# Patient Record
Sex: Female | Born: 1965 | Hispanic: Yes | Smoking: Never smoker
Health system: Southern US, Community
[De-identification: ages and names within clinical notes are randomized; demographics above are authoritative.]

## PROBLEM LIST (undated history)

## (undated) DIAGNOSIS — Z87442 Personal history of urinary calculi: Secondary | ICD-10-CM

## (undated) DIAGNOSIS — C801 Malignant (primary) neoplasm, unspecified: Secondary | ICD-10-CM

## (undated) DIAGNOSIS — F32A Depression, unspecified: Secondary | ICD-10-CM

## (undated) DIAGNOSIS — R42 Dizziness and giddiness: Secondary | ICD-10-CM

## (undated) DIAGNOSIS — M1990 Unspecified osteoarthritis, unspecified site: Secondary | ICD-10-CM

## (undated) DIAGNOSIS — J189 Pneumonia, unspecified organism: Secondary | ICD-10-CM

## (undated) DIAGNOSIS — I8393 Asymptomatic varicose veins of bilateral lower extremities: Secondary | ICD-10-CM

## (undated) DIAGNOSIS — F329 Major depressive disorder, single episode, unspecified: Secondary | ICD-10-CM

## (undated) DIAGNOSIS — F419 Anxiety disorder, unspecified: Secondary | ICD-10-CM

## (undated) HISTORY — PX: BREAST LUMPECTOMY: SHX2

## (undated) HISTORY — PX: TONSILLECTOMY: SUR1361

## (undated) HISTORY — DX: Malignant (primary) neoplasm, unspecified: C80.1

## (undated) HISTORY — PX: CHOLECYSTECTOMY: SHX55

## (undated) HISTORY — PX: DIAGNOSTIC LAPAROSCOPY: SUR761

---
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
## 2006-03-16 ENCOUNTER — Ambulatory Visit: Payer: Self-pay | Admitting: Unknown Physician Specialty

## 2009-09-22 ENCOUNTER — Ambulatory Visit: Payer: Self-pay

## 2010-12-29 ENCOUNTER — Ambulatory Visit: Payer: Self-pay

## 2012-07-09 ENCOUNTER — Ambulatory Visit: Payer: Self-pay | Admitting: Family Medicine

## 2012-11-14 HISTORY — PX: TONSILLECTOMY: SUR1361

## 2014-03-25 DIAGNOSIS — I8393 Asymptomatic varicose veins of bilateral lower extremities: Secondary | ICD-10-CM | POA: Insufficient documentation

## 2014-03-25 DIAGNOSIS — R32 Unspecified urinary incontinence: Secondary | ICD-10-CM | POA: Insufficient documentation

## 2014-05-08 ENCOUNTER — Ambulatory Visit: Payer: Self-pay | Admitting: Internal Medicine

## 2014-07-25 ENCOUNTER — Ambulatory Visit: Payer: Self-pay | Admitting: Internal Medicine

## 2015-07-17 ENCOUNTER — Other Ambulatory Visit: Payer: Self-pay | Admitting: Infectious Diseases

## 2015-07-17 ENCOUNTER — Ambulatory Visit
Admission: RE | Admit: 2015-07-17 | Discharge: 2015-07-17 | Disposition: A | Discharge status: Home / Self Care | Payer: BLUE CROSS/BLUE SHIELD | Source: Ambulatory Visit | Attending: Infectious Diseases | Admitting: Infectious Diseases

## 2015-07-17 ENCOUNTER — Ambulatory Visit
Admission: RE | Admit: 2015-07-17 | Discharge: 2015-07-17 | Disposition: A | Discharge status: Home / Self Care | Payer: BLUE CROSS/BLUE SHIELD | Source: Ambulatory Visit | Attending: Internal Medicine | Admitting: Internal Medicine

## 2015-07-17 DIAGNOSIS — R7611 Nonspecific reaction to tuberculin skin test without active tuberculosis: Secondary | ICD-10-CM

## 2015-07-17 DIAGNOSIS — R079 Chest pain, unspecified: Secondary | ICD-10-CM | POA: Insufficient documentation

## 2015-12-30 HISTORY — PX: TUBAL LIGATION: SHX77

## 2016-07-12 ENCOUNTER — Other Ambulatory Visit: Payer: Self-pay | Admitting: Internal Medicine

## 2016-07-12 DIAGNOSIS — Z1231 Encounter for screening mammogram for malignant neoplasm of breast: Secondary | ICD-10-CM

## 2016-07-27 ENCOUNTER — Ambulatory Visit
Admission: RE | Admit: 2016-07-27 | Discharge: 2016-07-27 | Disposition: A | Discharge status: Home / Self Care | Payer: BLUE CROSS/BLUE SHIELD | Source: Ambulatory Visit | Attending: Internal Medicine | Admitting: Internal Medicine

## 2016-07-27 ENCOUNTER — Encounter: Payer: Self-pay | Admitting: Radiology

## 2016-07-27 DIAGNOSIS — Z1231 Encounter for screening mammogram for malignant neoplasm of breast: Secondary | ICD-10-CM | POA: Diagnosis present

## 2016-11-25 ENCOUNTER — Encounter (INDEPENDENT_AMBULATORY_CARE_PROVIDER_SITE_OTHER): Payer: Self-pay

## 2016-11-25 ENCOUNTER — Encounter (INDEPENDENT_AMBULATORY_CARE_PROVIDER_SITE_OTHER): Payer: BLUE CROSS/BLUE SHIELD

## 2016-11-25 ENCOUNTER — Ambulatory Visit (INDEPENDENT_AMBULATORY_CARE_PROVIDER_SITE_OTHER): Payer: Self-pay | Admitting: Vascular Surgery

## 2016-12-27 ENCOUNTER — Ambulatory Visit (INDEPENDENT_AMBULATORY_CARE_PROVIDER_SITE_OTHER): Payer: BLUE CROSS/BLUE SHIELD | Admitting: Vascular Surgery

## 2016-12-27 ENCOUNTER — Encounter (INDEPENDENT_AMBULATORY_CARE_PROVIDER_SITE_OTHER): Payer: Self-pay | Admitting: Vascular Surgery

## 2016-12-27 ENCOUNTER — Ambulatory Visit (INDEPENDENT_AMBULATORY_CARE_PROVIDER_SITE_OTHER): Payer: BLUE CROSS/BLUE SHIELD

## 2016-12-27 ENCOUNTER — Other Ambulatory Visit (INDEPENDENT_AMBULATORY_CARE_PROVIDER_SITE_OTHER): Payer: Self-pay | Admitting: Vascular Surgery

## 2016-12-27 VITALS — BP 105/64 | HR 57 | Resp 16 | Ht 60.0 in | Wt 205.0 lb

## 2016-12-27 DIAGNOSIS — M7989 Other specified soft tissue disorders: Secondary | ICD-10-CM

## 2016-12-27 DIAGNOSIS — M79605 Pain in left leg: Secondary | ICD-10-CM

## 2016-12-27 DIAGNOSIS — M79604 Pain in right leg: Secondary | ICD-10-CM

## 2016-12-27 DIAGNOSIS — I83813 Varicose veins of bilateral lower extremities with pain: Secondary | ICD-10-CM

## 2016-12-27 NOTE — Assessment & Plan Note (Signed)
See treatment plan as below 

## 2016-12-27 NOTE — Assessment & Plan Note (Signed)
From venous disease refractory to conservative therapy.

## 2016-12-27 NOTE — Progress Notes (Signed)
Patient ID: Kelsey Chavez, female   DOB: 1965-11-18, 51 y.o.   MRN: [id number]  Chief Complaint  Patient presents with  . Re-evaluation    3 month follow up    HPI Kelsey Chavez is a 51 y.o. female.  Patient returns in follow up of their venous disease.  They have done their best to comply with the prescribed conservative therapies of compression stockings, leg elevation, exercise, and still requires anti-inflammatories for discomfort and has symptoms that are persistent and bothersome on a daily basis, affecting their activities of daily living and normal activities.  The venous reflux study demonstrates Bilateral great saphenous vein reflux without DVT or superficial thrombophlebitis. Her arterial studies were normal as well.Marland Kitchen   HPI  No past medical history on file.  No past surgical history on file.  No family history on file. No bleeding or clotting disorders  Social History Social History  Substance Use Topics  . Smoking status: Never Smoker  . Smokeless tobacco: Never Used  . Alcohol use 2.4 oz/week    2 Glasses of wine, 2 Cans of beer per week     Allergies not on file  No current outpatient prescriptions on file.   No current facility-administered medications for this visit.       REVIEW OF SYSTEMS (Negative unless checked)  Constitutional: -Weight loss  -Fever  -Chills Cardiac: -Chest Chavez   -Chest pressure   -Palpitations   -Shortness of breath when laying flat   -Shortness of breath at rest   -Shortness of breath with exertion. Vascular:  -Chavez in legs with walking   -Chavez in legs at rest   -Chavez in legs when laying flat   -Claudication   -Chavez in feet when walking  -Chavez in feet at rest  -Chavez in feet when laying flat   -History of DVT   -Phlebitis   -Swelling in legs   -Varicose veins   -Non-healing ulcers Pulmonary:   -Uses home oxygen   -Productive cough   -Hemoptysis   -Wheeze  -COPD   -Asthma Neurologic:  -Dizziness  -Blackouts   -Seizures    -History of stroke   -History of TIA  -Aphasia   -Temporary blindness   -Dysphagia   -Weakness or numbness in arms   -Weakness or numbness in legs Musculoskeletal:  -Arthritis   -Joint swelling   -Joint Chavez   -Low back Chavez Hematologic:  -Easy bruising  -Easy bleeding   -Hypercoagulable state   -Anemic  -Hepatitis Gastrointestinal:  -Blood in stool   -Vomiting blood  -Gastroesophageal reflux/heartburn   -Abdominal Chavez Genitourinary:  -Chronic kidney disease   -Difficult urination  -Frequent urination  -Burning with urination   -Hematuria Skin:  -Rashes   -Ulcers   -Wounds Psychological:  -History of anxiety   - History of major depression.    Physical Exam BP 105/64 (BP Location: Right Arm)   Pulse (!) 57   Resp 16   Ht 5' (1.524 m)   Wt 205 lb (93 kg)   BMI 40.04 kg/m  Gen:  WD/WN, NAD Head: [place]/AT, No temporalis wasting.  Ear/Nose/Throat: Hearing grossly intact, dentition good Eyes: Sclera non-icteric. Conjunctiva clear Neck: Supple. Trachea midline Pulmonary:  Good air movement, no use of accessory muscles, respirations not labored.  Cardiac: RRR, No JVD Vascular: Varicosities diffuse and measuring up to 3-4 mm in the right lower extremity        Varicosities diffuse and measuring up to 2-3 mm in the  left lower extremity Vessel Right Left  Radial Palpable Palpable  Ulnar Palpable Palpable  Brachial Palpable Palpable  Carotid Palpable, without bruit Palpable, without bruit  Aorta Not palpable N/A  Femoral Palpable Palpable  Popliteal Palpable Palpable  PT Trace Palpable 1+ Palpable  DP Palpable Palpable   Gastrointestinal: soft, non-tender/non-distended. No guarding/reflex. No masses, surgical incisions, or scars. Musculoskeletal: M/S 5/5 throughout.   1 + RLE edema.  Trace LLE edema Neurologic: Sensation grossly intact in extremities.  Symmetrical.  Speech is fluent.  Psychiatric: Judgment intact, Mood & affect appropriate for pt's clinical situation. Dermatologic: No  rashes or ulcers noted.  No cellulitis or open wounds. Lymph : No Cervical, Axillary, or Inguinal lymphadenopathy.   Radiology No results found.  Labs No results found for this or any previous visit (from the past 2160 hour(s)).  Assessment/Plan:  Swelling of limb From venous disease refractory to conservative therapy.  Varicose veins of leg with Chavez, bilateral See treatment plan as below.   The patient has done their best to comply with conservative therapy of 20-30 mm Hg compression stockings, leg elevation, exercise, and anti-inflammatories as needed for discomfort.  Despite this, they continue to have daily and persistent symptoms from their venous disease.  A venous reflux study demonstrates bilateral great saphenous vein reflux. She had normal arterial studies..  As such, the patient is likely to benefit from endovenous laser ablation of the great saphenous vein bilaterally in a staged fashion.  Risks and benefits of the procedure including bleeding, infection, recanalization, DVT, and need for further therapy for residual varicosities were discussed.  The patient voices their understanding and is agreeable to proceed with staged bilateral great saphenous vein laser ablation.     Kelsey Chavez 12/27/2016, 4:23 PM

## 2017-01-20 ENCOUNTER — Encounter (INDEPENDENT_AMBULATORY_CARE_PROVIDER_SITE_OTHER): Payer: Self-pay | Admitting: Vascular Surgery

## 2017-01-20 ENCOUNTER — Ambulatory Visit (INDEPENDENT_AMBULATORY_CARE_PROVIDER_SITE_OTHER): Payer: BLUE CROSS/BLUE SHIELD | Admitting: Vascular Surgery

## 2017-01-20 VITALS — BP 112/65 | HR 67 | Resp 17 | Wt 203.0 lb

## 2017-01-20 DIAGNOSIS — I83813 Varicose veins of bilateral lower extremities with pain: Secondary | ICD-10-CM

## 2017-01-20 DIAGNOSIS — M7989 Other specified soft tissue disorders: Secondary | ICD-10-CM

## 2017-01-20 NOTE — Progress Notes (Signed)
MRN : [id number]  Kelsey Chavez is a 51 y.o. ([birth year]) female who presents with chief complaint of  Chief Complaint  Patient presents with  . Follow-up  .  History of Present Illness: Patient returns today in follow up of her venous insufficiency.  She continues to wear stockings and elevate her legs.  She has further questions regarding the laser ablation procedure.  Her symptoms are unchanged.  PMH No significant medical issues  PSH None  Family History No bleeding or clotting disorders  Social History       Social History   Substance Use Topics   . Smoking status: Never Smoker   . Smokeless tobacco: Never Used   . Alcohol use 2.4 oz/week    2 Glasses of wine, 2 Cans of beer per week     Allergies not on file  No current outpatient prescriptions on file.   No current facility-administered medications for this visit.       REVIEW OF SYSTEMS (Negative unless checked)  Constitutional: -Weight loss  -Fever  -Chills Cardiac: -Chest pain   -Chest pressure   -Palpitations   -Shortness of breath when laying flat   -Shortness of breath at rest   -Shortness of breath with exertion. Vascular:  -Pain in legs with walking   -Pain in legs at rest   -Pain in legs when laying flat   -Claudication   -Pain in feet when walking  -Pain in feet at rest  -Pain in feet when laying flat   -History of DVT   -Phlebitis   -Swelling in legs   -Varicose veins   -Non-healing ulcers Pulmonary:   -Uses home oxygen   -Productive cough   -Hemoptysis   -Wheeze  -COPD   -Asthma Neurologic:  -Dizziness  -Blackouts   -Seizures   -History of stroke   -History of TIA  -Aphasia   -Temporary blindness   -Dysphagia   -Weakness or numbness in arms   -Weakness or numbness in legs Musculoskeletal:  -Arthritis   -Joint swelling   -Joint pain   -Low back pain Hematologic:  -Easy bruising  -Easy bleeding   -Hypercoagulable state   -Anemic  -Hepatitis Gastrointestinal:  -Blood in stool    -Vomiting blood  -Gastroesophageal reflux/heartburn   -Abdominal pain Genitourinary:  -Chronic kidney disease   -Difficult urination  -Frequent urination  -Burning with urination   -Hematuria Skin:  -Rashes   -Ulcers   -Wounds Psychological:  -History of anxiety   - History of major depression.    Physical Exam BP 105/64 (BP Location: Right Arm)   Pulse (!) 57   Resp 16   Ht 5' (1.524 m)   Wt 205 lb (93 kg)   BMI 40.04 kg/m  Gen:  WD/WN, NAD Head: [place]/AT, No temporalis wasting.  Ear/Nose/Throat: Hearing grossly intact, dentition good Eyes: Sclera non-icteric. Conjunctiva clear Neck: Supple. Trachea midline Pulmonary:  Good air movement, no use of accessory muscles, respirations not labored.  Cardiac: RRR, No JVD Vascular: Varicosities diffuse and measuring up to 3-4 mm in the right lower extremity                   Varicosities diffuse and measuring up to 2-3 mm in the left lower extremity Vessel Right Left  Radial Palpable Palpable  Ulnar Palpable Palpable  Brachial Palpable Palpable  Carotid Palpable, without bruit Palpable, without bruit  Aorta Not palpable N/A  Femoral Palpable Palpable  Popliteal Palpable Palpable  PT Trace Palpable  1+ Palpable  DP Palpable Palpable   Gastrointestinal: soft, non-tender/non-distended. No guarding/reflex. No masses, surgical incisions, or scars. Musculoskeletal: M/S 5/5 throughout.   1 + RLE edema.  Trace LLE edema Neurologic: Sensation grossly intact in extremities.  Symmetrical.  Speech is fluent.  Psychiatric: Judgment intact, Mood & affect appropriate for pt's clinical situation. Dermatologic: No rashes or ulcers noted.  No cellulitis or open wounds. Lymph : No Cervical, Axillary, or Inguinal lymphadenopathy.     Labs No results found for this or any previous visit (from the past 2160 hour(s)).  Radiology No results found.   Assessment/Plan  Swelling of limb From venous disease refractory to conservative  therapy.  Varicose veins of leg with pain, bilateral See treatment plan as below.   The patient has done their best to comply with conservative therapy of 20-30 mm Hg compression stockings, leg elevation, exercise, and anti-inflammatories as needed for discomfort.  Despite this, they continue to have daily and persistent symptoms from their venous disease.  A venous reflux study demonstrates bilateral great saphenous vein reflux. She had normal arterial studies..  As such, the patient is likely to benefit from endovenous laser ablation of the great saphenous vein bilaterally in a staged fashion.  Risks and benefits of the procedure including bleeding, infection, recanalization, DVT, and need for further therapy for residual varicosities were discussed.  The patient voices their understanding and is agreeable to proceed with staged bilateral great saphenous vein laser ablation. No problem-specific Assessment & Plan notes found for this encounter.    Leotis Pain, MD  01/20/2017 2:37 PM    This note was created with Dragon medical transcription system.  Any errors from dictation are purely unintentional

## 2017-01-20 NOTE — Patient Instructions (Signed)

## 2017-03-03 ENCOUNTER — Ambulatory Visit (INDEPENDENT_AMBULATORY_CARE_PROVIDER_SITE_OTHER): Payer: BLUE CROSS/BLUE SHIELD | Admitting: Vascular Surgery

## 2017-03-03 ENCOUNTER — Encounter (INDEPENDENT_AMBULATORY_CARE_PROVIDER_SITE_OTHER): Payer: Self-pay | Admitting: Vascular Surgery

## 2017-03-03 VITALS — BP 99/59 | HR 64 | Resp 16 | Wt 202.0 lb

## 2017-03-03 DIAGNOSIS — I83813 Varicose veins of bilateral lower extremities with pain: Secondary | ICD-10-CM | POA: Diagnosis not present

## 2017-03-03 NOTE — Assessment & Plan Note (Signed)
See left leg laser note

## 2017-03-03 NOTE — Progress Notes (Signed)
Varicose veins of leg with pain, bilateral See left leg laser note   The patient's left lower extremity was sterilely prepped and draped. The ultrasound machine was used to visualize the saphenous vein throughout its course. A segment in the upper calf was selected for access. The saphenous vein was accessed without difficulty using ultrasound guidance with a micro puncture needle. A micro puncture wire and sheath were then placed. A 0.018 wire was placed beyond the saphenofemoral junction through the sheath and the micro puncture sheath was removed. The 65 cm sheath was then placed over the wire and the wire and dilator were removed. The laser fiber was placed through the sheath and its tip was placed approximately 5 cm below the saphenofemoral junction. Tumescent anesthesia was then created with a dilute lidocaine solution. Laser energy was then delivered with constant withdrawal of the sheath and laser fiber. Approximately 1204 Joules of energy were delivered over a length of 33 cm using the 1470 Hz VenaCure machine at [employer] [facility] [employer]. Sterile dressings were placed. The patient tolerated the procedure well without complications.

## 2017-03-06 ENCOUNTER — Encounter (INDEPENDENT_AMBULATORY_CARE_PROVIDER_SITE_OTHER): Payer: BLUE CROSS/BLUE SHIELD

## 2017-03-08 ENCOUNTER — Ambulatory Visit (INDEPENDENT_AMBULATORY_CARE_PROVIDER_SITE_OTHER): Payer: BLUE CROSS/BLUE SHIELD

## 2017-03-08 DIAGNOSIS — I83813 Varicose veins of bilateral lower extremities with pain: Secondary | ICD-10-CM

## 2017-03-31 ENCOUNTER — Ambulatory Visit (INDEPENDENT_AMBULATORY_CARE_PROVIDER_SITE_OTHER): Payer: BLUE CROSS/BLUE SHIELD | Admitting: Vascular Surgery

## 2017-03-31 ENCOUNTER — Encounter (INDEPENDENT_AMBULATORY_CARE_PROVIDER_SITE_OTHER): Payer: Self-pay | Admitting: Vascular Surgery

## 2017-03-31 VITALS — BP 96/55 | HR 69 | Resp 16 | Wt 204.0 lb

## 2017-03-31 DIAGNOSIS — I83813 Varicose veins of bilateral lower extremities with pain: Secondary | ICD-10-CM | POA: Diagnosis not present

## 2017-03-31 DIAGNOSIS — M7989 Other specified soft tissue disorders: Secondary | ICD-10-CM | POA: Diagnosis not present

## 2017-03-31 NOTE — Assessment & Plan Note (Signed)
Left leg improved after laser ablation. Plan to proceed with right leg great saphenous vein laser ablation in the near future. Risks and benefits discussed.

## 2017-03-31 NOTE — Assessment & Plan Note (Signed)
Improved on the left leg after laser ablation

## 2017-03-31 NOTE — Progress Notes (Signed)
MRN : [id number]  Kelsey Chavez is a 51 y.o. (01-21-1966) female who presents with chief complaint of  Chief Complaint  Patient presents with  . Follow-up    post laser  .  History of Present Illness: Patient returns in follow up for Venous disease. She has undergone a left great saphenous vein laser ablation with good results. She notices a marked improvement in the pain and swelling in the left leg. She has right great saphenous vein reflux and is now ready to proceed with ablation on the right leg as well..    No past medical history on file.  No past surgical history on file.  Social History Social History  Substance Use Topics  . Smoking status: Never Smoker  . Smokeless tobacco: Never Used  . Alcohol use 2.4 oz/week    2 Glasses of wine, 2 Cans of beer per week    Family History No bleeding or clotting disorders  No current outpatient prescriptions on file.   No current facility-administered medications for this visit.     Allergies  Allergen Reactions  . Aspirin     Other reaction(s): Dizziness     REVIEW OF SYSTEMS (Negative unless checked)  Constitutional: [] Weight loss  [] Fever  [] Chills Cardiac: [] Chest pain   [] Chest pressure   [] Palpitations   [] Shortness of breath when laying flat   [] Shortness of breath at rest   [] Shortness of breath with exertion. Vascular:  [] Pain in legs with walking   [] Pain in legs at rest   [] Pain in legs when laying flat   [] Claudication   [] Pain in feet when walking  [] Pain in feet at rest  [] Pain in feet when laying flat   [] History of DVT   [] Phlebitis   [x] Swelling in legs   [x] Varicose veins   [] Non-healing ulcers Pulmonary:   [] Uses home oxygen   [] Productive cough   [] Hemoptysis   [] Wheeze  [] COPD    Neurologic:  [] Dizziness  [] Blackouts   [] Seizures   [] History of stroke   [] History of TIA  [] Aphasia   [] Temporary blindness   [] Dysphagia   [] Weakness or numbness in arms   [] Weakness or numbness in  legs Musculoskeletal:  [] Arthritis   [] Joint swelling   [] Joint pain   [] Low back pain Hematologic:  [] Easy bruising  [] Easy bleeding   [] Hypercoagulable state   [] Anemic  [] Thrombocytopenia Gastrointestinal:  [] Blood in stool   [] Vomiting blood  [] Gastroesophageal reflux/heartburn   [] Difficulty swallowing. Genitourinary:  [] Chronic kidney disease   [] Difficult urination  [] Frequent urination  [] Burning with urination   [] Blood in urine Skin:  [] Rashes   [] Ulcers   [] Wounds Psychological:  [] History of anxiety   []  History of major depression.  Physical Examination  Vitals:   03/31/17 1503  BP: (!) 96/55  Pulse: 69  Resp: 16  Weight: 204 lb (92.5 kg)   Body mass index is 39.84 kg/m. Gen:  WD/WN, NAD, Head: [place]/AT, No temporalis wasting. Ear/Nose/Throat: Hearing grossly intact, dentition Fair, trachea midline Eyes: Conjunctiva clear. Sclera non-icteric Neck: Supple.  No JVD. Trachea midline Pulmonary:  Good air movement, respirations not labored, no use of accessory muscles.  Cardiac: RRR, normal S1, S2. Vascular:  Vessel Right Left  Radial Palpable Palpable                                   Gastrointestinal: soft, non-tender/non-distended. No guarding/reflex.  Musculoskeletal: M/S  5/5 throughout.  No deformity or atrophy. Trace bilateral lower extremity edema. Scattered varicosities remaining on the left. Prominent varicosities remain on the right Neurologic: Sensation grossly intact in extremities.  Symmetrical.  Speech is fluent. Psychiatric: Judgment intact, Mood & affect appropriate for pt's clinical situation. Dermatologic: No rashes or ulcers noted.  No cellulitis or open wounds. Lymph : No Cervical, Axillary, or Inguinal lymphadenopathy.     Labs No results found for this or any previous visit (from the past 2160 hour(s)).  Radiology No results found.   Assessment/Plan  Swelling of limb Improved on the left leg after laser ablation  Varicose veins of  leg with pain, bilateral Left leg improved after laser ablation. Plan to proceed with right leg great saphenous vein laser ablation in the near future. Risks and benefits discussed.    Leotis Pain, MD  03/31/2017 4:18 PM    This note was created with Dragon medical transcription system.  Any errors from dictation are purely unintentional

## 2017-05-26 ENCOUNTER — Ambulatory Visit (INDEPENDENT_AMBULATORY_CARE_PROVIDER_SITE_OTHER): Payer: BLUE CROSS/BLUE SHIELD | Admitting: Vascular Surgery

## 2017-05-26 ENCOUNTER — Encounter (INDEPENDENT_AMBULATORY_CARE_PROVIDER_SITE_OTHER): Payer: Self-pay | Admitting: Vascular Surgery

## 2017-05-26 VITALS — BP 102/63 | HR 57 | Resp 17 | Wt 205.0 lb

## 2017-05-26 DIAGNOSIS — I83813 Varicose veins of bilateral lower extremities with pain: Secondary | ICD-10-CM

## 2017-05-26 NOTE — Assessment & Plan Note (Signed)
See laser note 

## 2017-05-26 NOTE — Progress Notes (Signed)
Varicose veins of leg with pain, bilateral See laser note   The patient's right lower extremity was sterilely prepped and draped. The ultrasound machine was used to visualize the saphenous vein throughout its course. A segment in the mid to upper calf was selected for access. The saphenous vein was accessed without difficulty using ultrasound guidance with a micro puncture needle. A micro puncture wire and sheath were then placed. A 0.018 wire was placed beyond the saphenofemoral junction through the sheath and the micro puncture sheath was removed. The 65 cm sheath was then placed over the wire and the wire and dilator were removed. The laser fiber was placed through the sheath and its tip was placed approximately 3-4 cm below the saphenofemoral junction. Tumescent anesthesia was then created with a dilute lidocaine solution. Laser energy was then delivered with constant withdrawal of the sheath and laser fiber. Approximately 1585 Joules of energy were delivered over a length of 37 cm using the 1470 Hz VenaCure machine at [employer] [facility]. Sterile dressings were placed. The patient tolerated the procedure well without complications.

## 2017-05-31 ENCOUNTER — Ambulatory Visit (INDEPENDENT_AMBULATORY_CARE_PROVIDER_SITE_OTHER): Payer: BLUE CROSS/BLUE SHIELD

## 2017-05-31 DIAGNOSIS — I83813 Varicose veins of bilateral lower extremities with pain: Secondary | ICD-10-CM | POA: Diagnosis not present

## 2017-06-20 ENCOUNTER — Ambulatory Visit (INDEPENDENT_AMBULATORY_CARE_PROVIDER_SITE_OTHER): Payer: BLUE CROSS/BLUE SHIELD | Admitting: Vascular Surgery

## 2017-06-20 ENCOUNTER — Encounter (INDEPENDENT_AMBULATORY_CARE_PROVIDER_SITE_OTHER): Payer: Self-pay | Admitting: Vascular Surgery

## 2017-06-20 VITALS — BP 104/57 | HR 64 | Resp 16 | Wt 204.0 lb

## 2017-06-20 DIAGNOSIS — I83813 Varicose veins of bilateral lower extremities with pain: Secondary | ICD-10-CM | POA: Diagnosis not present

## 2017-06-20 NOTE — Patient Instructions (Signed)
Sclerotherapy Sclerotherapy is a procedure that is done to improve the appearance of varicose veins and spider veins and to help relieve aching, swelling, cramping, and pain in the legs. Varicose veins are veins that have become enlarged, bulging, and twisted due to a damaged valve that causes blood to collect (pool) in the veins. Spider veins are small varicose veins. Sclerotherapy usually works best for smaller spider and varicose veins. This procedure involves injecting a chemical into the vein to close it off. You may need more than one treatment to close a vein all the way. Sclerotherapy is usually performed on the legs because that is where varicose and spider veins most often occur. Tell a health care provider about:  Any allergies you have.  All medicines you are taking, including vitamins, herbs, eye drops, creams, and over-the-counter medicines.  Any blood disorders you have.  Any surgeries you have had.  Any medical conditions you have.  Whether you are pregnant or may be pregnant. What are the risks? Generally, this is a safe procedure. However, problems may occur, including:  Infection.  Bleeding.  Allergic reactions to medicines or dyes.  Blood clots.  Nerve damage.  Bruising and scarring.  Darkened skin around the area.  What happens before the procedure?  Do not use lotions or creams on your legs unless your health care provider approves.  Follow instructions from your health care provider about eating and drinking restrictions.  Do not use any products that contain nicotine or tobacco, such as cigarettes and e-cigarettes. If you need help quitting, ask your health care provider.  Ask your health care provider about: ? Changing or stopping your regular medicines. This is especially important if you are taking diabetes medicines or blood thinners. ? Taking medicines such as aspirin and ibuprofen. These medicines can thin your blood. Do not take these  medicines before your procedure if your health care provider instructs you not to.  You may have an ultrasound of the affected area to check for blood clots and to check blood flow.  In rare cases, you may have an X-ray procedure to check how blood flows through your veins (angiogram). For an angiogram, a dye is injected to outline your veins on X-rays. What happens during the procedure?  To lower your risk of infection: ? Your health care team will wash or sanitize their hands. ? Your skin will be washed with soap. ? Hair may be removed from the treatment area.  A small, thin needle will be used to inject a chemical (sclerosant) into your varicose vein. The sclerosant will irritate the lining of the vein and cause the vein to close below the injection site. You may feel some stinging, burning, or irritation.  The injection may be repeated for more than one varicose vein.  The injection area will be wrapped with elastic bandages. The procedure may vary among health care providers and hospitals. What happens after the procedure?  Your injection area will be wrapped with elastic bandages. If there is bleeding, the bandages may be changed.  Do not drive until your health care provider approves. You may need to wait 1-2 days before driving.  You will need to wear compression stockings for about a week, or as long as your health care provider recommends. Summary  Sclerotherapy is a procedure that is done to improve the appearance of varicose veins and spider veins and to help relieve aching, swelling, cramping, and pain in the legs.  A small, thin needle is   used to inject a chemical (sclerosant) into a spider vein or varicose vein to close it off.  Elastic bandages will be wrapped around the injection area after the procedure. This information is not intended to replace advice given to you by your health care provider. Make sure you discuss any questions you have with your health care  provider. Document Released: 12/20/2016 Document Revised: 12/20/2016 Document Reviewed: 12/20/2016 [employer] Interactive Patient Education  2018 [employer]  

## 2017-06-20 NOTE — Progress Notes (Signed)
MRN : [id number]  Kelsey Chavez is a 51 y.o. (05-13-1966) female who presents with chief complaint of  Chief Complaint  Patient presents with  . post laser  .  History of Present Illness: Patient returns today in follow up of venous disease. She has undergone bilateral great saphenous vein laser ablations in a staged fashion over the past 3-4 months. She is doing well. She has noticed improvement but not resolution in the pain and swelling in her legs. She is bothered by pain for residual varicosities on both legs, maybe a little more on the right than the left. Both procedures were successful without DVT.  No current outpatient prescriptions on file.   No current facility-administered medications for this visit.    PMH Varicose veins  PSH Laser ablation of both GSV   Social History       Social History   Substance Use Topics   . Smoking status: Never Smoker   . Smokeless tobacco: Never Used   . Alcohol use 2.4 oz/week    2 Glasses of wine, 2 Cans of beer per week    Family History No bleeding or clotting disorders  No current outpatient prescriptions on file.   No current facility-administered medications for this visit.          Allergies  Allergen Reactions  . Aspirin     Other reaction(s): Dizziness     REVIEW OF SYSTEMS (Negative unless checked)  Constitutional: [] Weight loss  [] Fever  [] Chills Cardiac: [] Chest pain   [] Chest pressure   [] Palpitations   [] Shortness of breath when laying flat   [] Shortness of breath at rest   [] Shortness of breath with exertion. Vascular:  [] Pain in legs with walking   [] Pain in legs at rest   [] Pain in legs when laying flat   [] Claudication   [] Pain in feet when walking  [] Pain in feet at rest  [] Pain in feet when laying flat   [] History of DVT   [] Phlebitis   [x] Swelling in legs   [x] Varicose veins   [] Non-healing ulcers Pulmonary:   [] Uses home oxygen   [] Productive cough   [] Hemoptysis   [] Wheeze   [] COPD    Neurologic:  [] Dizziness  [] Blackouts   [] Seizures   [] History of stroke   [] History of TIA  [] Aphasia   [] Temporary blindness   [] Dysphagia   [] Weakness or numbness in arms   [] Weakness or numbness in legs Musculoskeletal:  [] Arthritis   [] Joint swelling   [] Joint pain   [] Low back pain Hematologic:  [] Easy bruising  [] Easy bleeding   [] Hypercoagulable state   [] Anemic  [] Thrombocytopenia Gastrointestinal:  [] Blood in stool   [] Vomiting blood  [] Gastroesophageal reflux/heartburn   [] Difficulty swallowing. Genitourinary:  [] Chronic kidney disease   [] Difficult urination  [] Frequent urination  [] Burning with urination   [] Blood in urine Skin:  [] Rashes   [] Ulcers   [] Wounds Psychological:  [] History of anxiety   []  History of major depression.   Physical Examination  BP (!) 104/57   Pulse 64   Resp 16   Wt 204 lb (92.5 kg)   BMI 39.84 kg/m  Gen:  WD/WN, NAD Head: [place]/AT, No temporalis wasting. Ear/Nose/Throat: Hearing grossly intact, nares w/o erythema or drainage, trachea midline Eyes: Conjunctiva clear. Sclera non-icteric Neck: Supple.  No JVD.  Pulmonary:  Good air movement, no use of accessory muscles.  Cardiac: RRR, normal S1, S2 Vascular: Scattered varicosities bilaterally measuring 1-2 mm in diameter. A little more on the  right and the left. Vessel Right Left  Radial Palpable Palpable  Ulnar Palpable Palpable  Brachial Palpable Palpable  Carotid Palpable, without bruit Palpable, without bruit  Aorta Not palpable N/A  Femoral Palpable Palpable  Popliteal Palpable Palpable  PT Palpable Palpable  DP Palpable Palpable    Musculoskeletal: M/S 5/5 throughout.  No deformity or atrophy. No significant lower extremity edema. Neurologic: Sensation grossly intact in extremities.  Symmetrical.  Speech is fluent.  Psychiatric: Judgment intact, Mood & affect appropriate for pt's clinical situation. Dermatologic: No rashes or ulcers noted.  No cellulitis or open  wounds.       Labs No results found for this or any previous visit (from the past 2160 hour(s)).  Radiology No results found.    Assessment/Plan  Varicose veins of leg with pain, bilateral Recommend:  The patient has had successful ablation of the previously incompetent saphenous venous system but still has persistent symptoms of pain and swelling that are having a negative impact on daily life and daily activities.  Patient should undergo injection sclerotherapy to treat the residual varicosities.  The risks, benefits and alternative therapies were reviewed in detail with the patient.  All questions were answered.  The patient agrees to proceed with sclerotherapy at their convenience.  The patient will continue wearing the graduated compression stockings and using the over-the-counter pain medications to treat her symptoms.         Leotis Pain, MD  06/20/2017 12:04 PM    This note was created with Dragon medical transcription system.  Any errors from dictation are purely unintentional

## 2017-06-20 NOTE — Assessment & Plan Note (Signed)
Recommend:  The patient has had successful ablation of the previously incompetent saphenous venous system but still has persistent symptoms of pain and swelling that are having a negative impact on daily life and daily activities.  Patient should undergo injection sclerotherapy to treat the residual varicosities.  The risks, benefits and alternative therapies were reviewed in detail with the patient.  All questions were answered.  The patient agrees to proceed with sclerotherapy at their convenience.  The patient will continue wearing the graduated compression stockings and using the over-the-counter pain medications to treat her symptoms.     

## 2017-07-25 ENCOUNTER — Other Ambulatory Visit: Payer: Self-pay | Admitting: Internal Medicine

## 2017-08-02 DIAGNOSIS — F520 Hypoactive sexual desire disorder: Secondary | ICD-10-CM | POA: Insufficient documentation

## 2017-08-21 ENCOUNTER — Other Ambulatory Visit: Payer: Self-pay | Admitting: Internal Medicine

## 2017-08-21 DIAGNOSIS — Z1231 Encounter for screening mammogram for malignant neoplasm of breast: Secondary | ICD-10-CM

## 2017-08-28 ENCOUNTER — Ambulatory Visit
Admission: RE | Admit: 2017-08-28 | Discharge: 2017-08-28 | Disposition: A | Discharge status: Home / Self Care | Payer: BLUE CROSS/BLUE SHIELD | Source: Ambulatory Visit | Attending: Internal Medicine | Admitting: Internal Medicine

## 2017-08-28 DIAGNOSIS — Z1231 Encounter for screening mammogram for malignant neoplasm of breast: Secondary | ICD-10-CM | POA: Insufficient documentation

## 2018-02-08 DIAGNOSIS — R0683 Snoring: Secondary | ICD-10-CM | POA: Insufficient documentation

## 2018-02-08 DIAGNOSIS — R42 Dizziness and giddiness: Secondary | ICD-10-CM | POA: Insufficient documentation

## 2018-04-15 DIAGNOSIS — G4733 Obstructive sleep apnea (adult) (pediatric): Secondary | ICD-10-CM | POA: Insufficient documentation

## 2018-07-27 ENCOUNTER — Ambulatory Visit (INDEPENDENT_AMBULATORY_CARE_PROVIDER_SITE_OTHER): Payer: BLUE CROSS/BLUE SHIELD | Admitting: Vascular Surgery

## 2018-07-27 ENCOUNTER — Encounter (INDEPENDENT_AMBULATORY_CARE_PROVIDER_SITE_OTHER): Payer: Self-pay | Admitting: Vascular Surgery

## 2018-07-27 ENCOUNTER — Ambulatory Visit (INDEPENDENT_AMBULATORY_CARE_PROVIDER_SITE_OTHER): Payer: BLUE CROSS/BLUE SHIELD

## 2018-07-27 VITALS — BP 124/65 | HR 62 | Resp 16 | Ht 60.0 in | Wt 202.0 lb

## 2018-07-27 DIAGNOSIS — M79609 Pain in unspecified limb: Secondary | ICD-10-CM | POA: Insufficient documentation

## 2018-07-27 DIAGNOSIS — I83813 Varicose veins of bilateral lower extremities with pain: Secondary | ICD-10-CM

## 2018-07-27 DIAGNOSIS — M79605 Pain in left leg: Secondary | ICD-10-CM

## 2018-07-27 NOTE — Assessment & Plan Note (Signed)
We discussed the role of sclerotherapy for the residual varicosities on both lower extremities.  I think this would be an excellent option to remove the painful residual varicosities from both legs.  Patient desires to proceed

## 2018-07-27 NOTE — Assessment & Plan Note (Signed)
Duplex today shows continued ablation of the left great saphenous vein without DVT or superficial thrombophlebitis.  Continue wearing compression stockings and elevating her legs.

## 2018-07-27 NOTE — Progress Notes (Signed)
MRN : [id number]  Kelsey Chavez is a 52 y.o. (09/27/1966) female who presents with chief complaint of  Chief Complaint  Patient presents with  . Follow-up    left leg pain  .  History of Present Illness: Patient returns in follow up for venous disease and leg pain.  Last year, she underwent bilateral great saphenous vein laser ablation and initially did well.  She is bothered by some painful residual varicosities on both legs.  She has also noticed increasing pain in the left lower leg over the past few weeks.  She was concerned she could have a new vein problem or blood clot.  A duplex was done today which showed ablation of the left great saphenous vein without DVT or superficial thrombophlebitis in the left lower extremity.  He has begun wearing her compression stockings again which has helped.  She does have some mild swelling in both legs.   No current outpatient prescriptions on file.   No current facility-administered medications for this visit.    PMH Varicose veins  PSH Laser ablation of both GSV   Social History       Social History   Substance Use Topics   . Smoking status: Never Smoker   . Smokeless tobacco: Never Used   . Alcohol use 2.4 oz/week    2 Glasses of wine, 2 Cans of beer per week    Family History No bleeding or clotting disorders  No current outpatient prescriptions on file.   No current facility-administered medications for this visit.          Allergies  Allergen Reactions  . Aspirin     Other reaction(s): Dizziness     REVIEW OF SYSTEMS(Negative unless checked)  Constitutional: [] Weight loss[] Fever[] Chills Cardiac:[] Chest pain[] Chest pressure[] Palpitations [] Shortness of breath when laying flat [] Shortness of breath at rest [] Shortness of breath with exertion. Vascular: [] Pain in legs with walking[] Pain in legsat rest[] Pain in legs when laying flat [] Claudication  [] Pain in feet when walking [] Pain in feet at rest [] Pain in feet when laying flat [] History of DVT [] Phlebitis [x] Swelling in legs [x] Varicose veins [] Non-healing ulcers Pulmonary: [] Uses home oxygen [] Productive cough[] Hemoptysis [] Wheeze [] COPD  Neurologic: [] Dizziness [] Blackouts [] Seizures [] History of stroke [] History of TIA[] Aphasia [] Temporary blindness[] Dysphagia [] Weaknessor numbness in arms [] Weakness or numbnessin legs Musculoskeletal: [] Arthritis [] Joint swelling [] Joint pain [] Low back pain Hematologic:[] Easy bruising[] Easy bleeding [] Hypercoagulable state [] Anemic [] Thrombocytopenia Gastrointestinal:[] Blood in stool[] Vomiting blood[] Gastroesophageal reflux/heartburn[] Difficulty swallowing. Genitourinary: [] Chronic kidney disease [] Difficulturination [] Frequenturination [] Burning with urination[] Blood in urine Skin: [] Rashes [] Ulcers [] Wounds Psychological: [] History of anxiety[] History of major depression.    Physical Examination  Vitals:   07/27/18 1628  BP: 124/65  Pulse: 62  Resp: 16  Weight: 202 lb (91.6 kg)  Height: 5' (1.524 m)   Body mass index is 39.45 kg/m. Gen:  WD/WN, NAD Head: [place]/AT, No temporalis wasting. Ear/Nose/Throat: Hearing grossly intact, dentition good, trachea midline Eyes: Conjunctiva clear. Sclera non-icteric Neck: Supple. Trachea midline Pulmonary:  Good air movement, respirations not labored, no use of accessory muscles.  Cardiac: RRR, no JVD Vascular: A prominent varicosity on the medial right thigh and knee area.  Some varicosities are also present on the left anterior and lateral thigh and lower leg. Vessel Right Left  Radial Palpable Palpable                                   Musculoskeletal: M/S 5/5 throughout.  No deformity or atrophy. Mild  BLE edema. Neurologic: Sensation grossly intact in extremities.  Symmetrical.  Speech is  fluent. Psychiatric: Judgment intact, Mood & affect appropriate for pt's clinical situation. Dermatologic: No rashes or ulcers noted.  No cellulitis or open wounds.      Labs No results found for this or any previous visit (from the past 2160 hour(s)).  Radiology No results found.   Assessment/Plan  Pain in limb Duplex today shows continued ablation of the left great saphenous vein without DVT or superficial thrombophlebitis.  Continue wearing compression stockings and elevating her legs.  Varicose veins of leg with pain, bilateral We discussed the role of sclerotherapy for the residual varicosities on both lower extremities.  I think this would be an excellent option to remove the painful residual varicosities from both legs.  Patient desires to proceed    Leotis Pain, MD  07/27/2018 4:54 PM    This note was created with Dragon medical transcription system.  Any errors from dictation are purely unintentional

## 2018-09-03 ENCOUNTER — Other Ambulatory Visit: Payer: Self-pay | Admitting: Internal Medicine

## 2018-09-03 DIAGNOSIS — Z1231 Encounter for screening mammogram for malignant neoplasm of breast: Secondary | ICD-10-CM

## 2018-09-27 ENCOUNTER — Ambulatory Visit
Admission: RE | Admit: 2018-09-27 | Discharge: 2018-09-27 | Disposition: A | Discharge status: Home / Self Care | Payer: BLUE CROSS/BLUE SHIELD | Source: Ambulatory Visit | Attending: Internal Medicine | Admitting: Internal Medicine

## 2018-09-27 DIAGNOSIS — Z1231 Encounter for screening mammogram for malignant neoplasm of breast: Secondary | ICD-10-CM

## 2019-05-09 ENCOUNTER — Other Ambulatory Visit
Admission: RE | Admit: 2019-05-09 | Discharge: 2019-05-09 | Disposition: A | Discharge status: Home / Self Care | Payer: BLUE CROSS/BLUE SHIELD | Source: Ambulatory Visit | Attending: Unknown Physician Specialty | Admitting: Unknown Physician Specialty

## 2019-05-09 ENCOUNTER — Other Ambulatory Visit: Payer: Self-pay

## 2019-05-09 DIAGNOSIS — Z1159 Encounter for screening for other viral diseases: Secondary | ICD-10-CM | POA: Insufficient documentation

## 2019-05-10 LAB — NOVEL CORONAVIRUS, NAA (HOSP ORDER, SEND-OUT TO REF LAB; TAT 18-24 HRS): SARS-CoV-2, NAA: NOT DETECTED

## 2019-05-12 ENCOUNTER — Encounter: Payer: Self-pay | Admitting: Anesthesiology

## 2019-05-13 ENCOUNTER — Encounter: Payer: Self-pay | Admitting: *Deleted

## 2019-05-13 ENCOUNTER — Ambulatory Visit: Payer: BLUE CROSS/BLUE SHIELD | Admitting: Anesthesiology

## 2019-05-13 ENCOUNTER — Ambulatory Visit
Admission: RE | Admit: 2019-05-13 | Discharge: 2019-05-13 | Disposition: A | Discharge status: Home / Self Care | Payer: BLUE CROSS/BLUE SHIELD | Attending: Unknown Physician Specialty | Admitting: Unknown Physician Specialty

## 2019-05-13 ENCOUNTER — Encounter
Admission: RE | Disposition: A | Discharge status: Home / Self Care | Payer: Self-pay | Source: Home / Self Care | Attending: Unknown Physician Specialty

## 2019-05-13 ENCOUNTER — Other Ambulatory Visit: Payer: Self-pay

## 2019-05-13 DIAGNOSIS — D120 Benign neoplasm of cecum: Secondary | ICD-10-CM | POA: Insufficient documentation

## 2019-05-13 DIAGNOSIS — Z886 Allergy status to analgesic agent status: Secondary | ICD-10-CM | POA: Diagnosis not present

## 2019-05-13 DIAGNOSIS — K640 First degree hemorrhoids: Secondary | ICD-10-CM | POA: Insufficient documentation

## 2019-05-13 DIAGNOSIS — Z7982 Long term (current) use of aspirin: Secondary | ICD-10-CM | POA: Diagnosis not present

## 2019-05-13 DIAGNOSIS — Z9049 Acquired absence of other specified parts of digestive tract: Secondary | ICD-10-CM | POA: Insufficient documentation

## 2019-05-13 DIAGNOSIS — D123 Benign neoplasm of transverse colon: Secondary | ICD-10-CM | POA: Diagnosis not present

## 2019-05-13 DIAGNOSIS — Z79899 Other long term (current) drug therapy: Secondary | ICD-10-CM | POA: Insufficient documentation

## 2019-05-13 DIAGNOSIS — F329 Major depressive disorder, single episode, unspecified: Secondary | ICD-10-CM | POA: Insufficient documentation

## 2019-05-13 DIAGNOSIS — Z1211 Encounter for screening for malignant neoplasm of colon: Secondary | ICD-10-CM | POA: Diagnosis present

## 2019-05-13 HISTORY — DX: Depression, unspecified: F32.A

## 2019-05-13 HISTORY — DX: Asymptomatic varicose veins of bilateral lower extremities: I83.93

## 2019-05-13 HISTORY — PX: COLONOSCOPY WITH PROPOFOL: SHX5780

## 2019-05-13 LAB — POCT PREGNANCY, URINE: Preg Test, Ur: NEGATIVE

## 2019-05-13 SURGERY — COLONOSCOPY WITH PROPOFOL
Anesthesia: General

## 2019-05-13 MED ORDER — PROPOFOL 10 MG/ML IV BOLUS
INTRAVENOUS | Status: AC
  Filled 2019-05-13: qty 20

## 2019-05-13 MED ORDER — SODIUM CHLORIDE 0.9 % IV SOLN
INTRAVENOUS | Status: DC
  Administered 2019-05-13: 1000 mL via INTRAVENOUS

## 2019-05-13 MED ORDER — PROPOFOL 500 MG/50ML IV EMUL
INTRAVENOUS | Status: AC
  Filled 2019-05-13: qty 50

## 2019-05-13 MED ORDER — PHENYLEPHRINE HCL (PRESSORS) 10 MG/ML IV SOLN
INTRAVENOUS | Status: DC | PRN
  Administered 2019-05-13 (×2): 100 ug via INTRAVENOUS

## 2019-05-13 MED ORDER — LIDOCAINE HCL (CARDIAC) PF 100 MG/5ML IV SOSY
PREFILLED_SYRINGE | INTRAVENOUS | Status: DC | PRN
  Administered 2019-05-13: 50 mg via INTRAVENOUS

## 2019-05-13 MED ORDER — SODIUM CHLORIDE 0.9 % IV SOLN
INTRAVENOUS | Status: DC | PRN
  Administered 2019-05-13: 11:00:00 via INTRAVENOUS

## 2019-05-13 MED ORDER — SODIUM CHLORIDE 0.9 % IV SOLN: INTRAVENOUS | Status: DC

## 2019-05-13 MED ORDER — PROPOFOL 500 MG/50ML IV EMUL
INTRAVENOUS | Status: DC | PRN
  Administered 2019-05-13: 125 ug/kg/min via INTRAVENOUS

## 2019-05-13 MED ORDER — PROPOFOL 10 MG/ML IV BOLUS
INTRAVENOUS | Status: DC | PRN
  Administered 2019-05-13: 50 mg via INTRAVENOUS

## 2019-05-13 NOTE — H&P (Signed)
Primary Care Physician:  Tracie Harrier, MD Primary Gastroenterologist:  Dr. Vira Agar  Pre-Procedure History & Physical: HPI:  Kelsey Chavez is a 53 y.o. female is here for an colonoscopy. Doing for colon cancer screening.   Past Medical History:  Diagnosis Date  . Depression   . Varicose veins of both lower extremities     Past Surgical History:  Procedure Laterality Date  . CESAREAN SECTION  1996  . CHOLECYSTECTOMY    . DIAGNOSTIC LAPAROSCOPY    . TONSILLECTOMY      Prior to Admission medications   Medication Sig Start Date End Date Taking? Authorizing Provider  calcium gluconate 500 MG tablet Take 1 tablet by mouth daily.   Yes [provider]  Multiple Vitamin (MULTIVITAMIN) tablet Take 1 tablet by mouth 3 (three) times daily.   Yes [provider]  omega-3 acid ethyl esters (LOVAZA) 1 g capsule Take 1 g by mouth daily.   Yes [provider]  phentermine 37.5 MG capsule Take 37.5 mg by mouth every morning.    [provider]    Allergies as of 04/26/2019 - Review Complete 07/27/2018  Allergen Reaction Noted  . Aspirin  03/25/2014    History reviewed. No pertinent family history.  Social History   Socioeconomic History  . Marital status: Legally Separated    Spouse name: Not on file  . Number of children: Not on file  . Years of education: Not on file  . Highest education level: Not on file  Occupational History  . Not on file  Social Needs  . Financial resource strain: Not on file  . Food insecurity    Worry: Not on file    Inability: Not on file  . Transportation needs    Medical: Not on file    Non-medical: Not on file  Tobacco Use  . Smoking status: Never Smoker  . Smokeless tobacco: Never Used  Substance and Sexual Activity  . Alcohol use: Yes    Alcohol/week: 4.0 standard drinks    Types: 2 Glasses of wine, 2 Cans of beer per week  . Drug use: No  . Sexual activity: Not on file  Lifestyle  . Physical  activity    Days per week: Not on file    Minutes per session: Not on file  . Stress: Not on file  Relationships  . Social Herbalist on phone: Not on file    Gets together: Not on file    Attends religious service: Not on file    Active member of club or organization: Not on file    Attends meetings of clubs or organizations: Not on file    Relationship status: Not on file  . Intimate partner violence    Fear of current or ex partner: Not on file    Emotionally abused: Not on file    Physically abused: Not on file    Forced sexual activity: Not on file  Other Topics Concern  . Not on file  Social History Narrative  . Not on file    Review of Systems: See HPI, otherwise negative ROS  Physical Exam: BP 97/71   Pulse 68   Temp (!) 97.1 F (36.2 C) (Tympanic)   Resp 15   Ht 5' (1.524 m)   Wt 92.5 kg   SpO2 99%   BMI 39.84 kg/m  General:   Alert,  pleasant and cooperative in NAD Head:  Normocephalic and atraumatic. Neck:  Supple;  no masses or thyromegaly. Lungs:  Clear throughout to auscultation.    Heart:  Regular rate and rhythm. Abdomen:  Soft, nontender and nondistended. Normal bowel sounds, without guarding, and without rebound.   Neurologic:  Alert and  oriented x4;  grossly normal neurologically.  Impression/Plan: Kelsey Chavez is here for an Done for colon cancer screening. to be performed for Colon cancer screening.  Risks, benefits, limitations, and alternatives regarding  colonoscopy have been reviewed with the patient.  Questions have been answered.  All parties agreeable.   Gaylyn Cheers, MD  05/13/2019, 12:43 PM

## 2019-05-13 NOTE — Anesthesia Post-op Follow-up Note (Signed)
Anesthesia QCDR form completed.        

## 2019-05-13 NOTE — Transfer of Care (Signed)
Immediate Anesthesia Transfer of Care Note  Patient: Kelsey Chavez  Procedure(s) Performed: COLONOSCOPY WITH PROPOFOL (N/A )  Patient Location: PACU  Anesthesia Type:MAC  Level of Consciousness: awake, alert  and oriented  Airway & Oxygen Therapy: Patient Spontanous Breathing  Post-op Assessment: Report given to RN and Post -op Vital signs reviewed and stable  Post vital signs: Reviewed and stable  Last Vitals:  Vitals Value Taken Time  BP 97/61 05/13/19 1138  Temp    Pulse 69 05/13/19 1138  Resp 13 05/13/19 1138  SpO2 99 % 05/13/19 1138  Vitals shown include unvalidated device data.  Last Pain:  Vitals:   05/13/19 1034  TempSrc: Tympanic  PainSc: 0-No pain         Complications: No apparent anesthesia complications

## 2019-05-13 NOTE — Op Note (Signed)
[facility] Gastroenterology Patient Name: Kelsey Chavez Procedure Date: 05/13/2019 10:48 AM MRN: [id number] Account #: [id number] Date of Birth: [birth year] Admit Type: Outpatient Age: 53 Room: [facility] ENDO ROOM 1 Gender: Female Note Status: Finalized Procedure:            Colonoscopy Indications:          Screening for colorectal malignant neoplasm Providers:            Manya Silvas, MD Referring MD:         Tracie Harrier, MD (Referring MD) Medicines:            Propofol per Anesthesia Complications:        No immediate complications. Procedure:            Pre-Anesthesia Assessment:                       - After reviewing the risks and benefits, the patient                        was deemed in satisfactory condition to undergo the                        procedure.                       After obtaining informed consent, the colonoscope was                        passed under direct vision. Throughout the procedure,                        the patient's blood pressure, pulse, and oxygen                        saturations were monitored continuously. The                        Colonoscope was introduced through the anus and                        advanced to the the cecum, identified by appendiceal                        orifice and ileocecal valve. The colonoscopy was                        performed without difficulty. The patient tolerated the                        procedure well. The quality of the bowel preparation                        was good. Findings:      A 6 mm polyp was found in the cecum. The polyp was sessile. The polyp       was removed with a hot snare. Resection and retrieval were complete.      A small polyp was found in the transverse colon. The polyp was sessile.       The polyp was removed with a hot snare. Resection and retrieval were       complete. To prevent  bleeding after the polypectomy, two hemostatic       clips were  successfully placed. There was no bleeding during, or at the       end, of the procedure.      Internal hemorrhoids were found during endoscopy. The hemorrhoids were       small and Grade I (internal hemorrhoids that do not prolapse). Impression:           - One 6 mm polyp in the cecum, removed with a hot                        snare. Resected and retrieved.                       - One small polyp in the transverse colon, removed with                        a hot snare. Resected and retrieved. Clips were placed.                       - Internal hemorrhoids. Recommendation:       - Await pathology results. Manya Silvas, MD 05/13/2019 11:53:08 AM This report has been signed electronically. Number of Addenda: 0 Note Initiated On: 05/13/2019 10:48 AM Scope Withdrawal Time: 0 hours 38 minutes 28 seconds  Total Procedure Duration: 0 hours 48 minutes 10 seconds  Estimated Blood Loss: Estimated blood loss: none.      [facility]

## 2019-05-13 NOTE — Anesthesia Preprocedure Evaluation (Addendum)
Anesthesia Evaluation  Patient identified by MRN, date of birth, ID band Patient awake    Reviewed: Allergy & Precautions, NPO status , Patient's Chart, lab work & pertinent test results, reviewed documented beta blocker date and time   Airway Mallampati: II  TM Distance: >3 FB     Dental  (+) Chipped   Pulmonary           Cardiovascular      Neuro/Psych    GI/Hepatic   Endo/Other    Renal/GU      Musculoskeletal   Abdominal   Peds  Hematology   Anesthesia Other Findings   Reproductive/Obstetrics                             Anesthesia Physical Anesthesia Plan  ASA: II  Anesthesia Plan: General   Post-op Pain Management:    Induction: Intravenous  PONV Risk Score and Plan:   Airway Management Planned:   Additional Equipment:   Intra-op Plan:   Post-operative Plan:   Informed Consent: I have reviewed the patients History and Physical, chart, labs and discussed the procedure including the risks, benefits and alternatives for the proposed anesthesia with the patient or authorized representative who has indicated his/her understanding and acceptance.     Plan Discussed with: CRNA  Anesthesia Plan Comments:         Anesthesia Quick Evaluation  

## 2019-05-13 NOTE — Anesthesia Postprocedure Evaluation (Signed)
Anesthesia Post Note  Patient: Kelsey Chavez  Procedure(s) Performed: COLONOSCOPY WITH PROPOFOL (N/A )  Patient location during evaluation: Endoscopy Anesthesia Type: General Level of consciousness: awake and alert Pain management: pain level controlled Vital Signs Assessment: post-procedure vital signs reviewed and stable Respiratory status: spontaneous breathing, nonlabored ventilation, respiratory function stable and patient connected to nasal cannula oxygen Cardiovascular status: blood pressure returned to baseline and stable Postop Assessment: no apparent nausea or vomiting Anesthetic complications: no     Last Vitals:  Vitals:   05/13/19 1137 05/13/19 1207  BP: 97/61 97/71  Pulse: 68   Resp: 15   Temp: (!) 36.2 C   SpO2: 99%     Last Pain:  Vitals:   05/13/19 1207  TempSrc:   PainSc: 0-No pain                 [name]

## 2019-05-13 NOTE — H&P (Deleted)
Primary Care Physician:  Tracie Harrier, MD Primary Gastroenterologist:  Dr. Vira Agar  Pre-Procedure History & Physical: HPI:  Kelsey Chavez is a 53 y.o. female is here for an colonoscopy.   Past Medical History:  Diagnosis Date  . Depression   . Varicose veins of both lower extremities     Past Surgical History:  Procedure Laterality Date  . CESAREAN SECTION  1996  . CHOLECYSTECTOMY    . DIAGNOSTIC LAPAROSCOPY    . TONSILLECTOMY      Prior to Admission medications   Medication Sig Start Date End Date Taking? Authorizing Provider  calcium gluconate 500 MG tablet Take 1 tablet by mouth daily.   Yes [provider]  Multiple Vitamin (MULTIVITAMIN) tablet Take 1 tablet by mouth 3 (three) times daily.   Yes [provider]  omega-3 acid ethyl esters (LOVAZA) 1 g capsule Take 1 g by mouth daily.   Yes [provider]  phentermine 37.5 MG capsule Take 37.5 mg by mouth every morning.    [provider]    Allergies as of 04/26/2019 - Review Complete 07/27/2018  Allergen Reaction Noted  . Aspirin  03/25/2014    History reviewed. No pertinent family history.  Social History   Socioeconomic History  . Marital status: Legally Separated    Spouse name: Not on file  . Number of children: Not on file  . Years of education: Not on file  . Highest education level: Not on file  Occupational History  . Not on file  Social Needs  . Financial resource strain: Not on file  . Food insecurity    Worry: Not on file    Inability: Not on file  . Transportation needs    Medical: Not on file    Non-medical: Not on file  Tobacco Use  . Smoking status: Never Smoker  . Smokeless tobacco: Never Used  Substance and Sexual Activity  . Alcohol use: Yes    Alcohol/week: 4.0 standard drinks    Types: 2 Glasses of wine, 2 Cans of beer per week  . Drug use: No  . Sexual activity: Not on file  Lifestyle  . Physical activity    Days per week: Not  on file    Minutes per session: Not on file  . Stress: Not on file  Relationships  . Social Herbalist on phone: Not on file    Gets together: Not on file    Attends religious service: Not on file    Active member of club or organization: Not on file    Attends meetings of clubs or organizations: Not on file    Relationship status: Not on file  . Intimate partner violence    Fear of current or ex partner: Not on file    Emotionally abused: Not on file    Physically abused: Not on file    Forced sexual activity: Not on file  Other Topics Concern  . Not on file  Social History Narrative  . Not on file    Review of Systems: See HPI, otherwise negative ROS  Physical Exam: BP 106/85   Pulse 68   Temp 97.6 F (36.4 C) (Tympanic)   Resp 18   Ht 5' (1.524 m)   Wt 92.5 kg   SpO2 99%   BMI 39.84 kg/m  General:   Alert,  pleasant and cooperative in NAD Head:  Normocephalic and atraumatic. Neck:  Supple; no masses or thyromegaly. Lungs:  Clear throughout to auscultation.    Heart:  Regular rate and rhythm. Abdomen:  Soft, nontender and nondistended. Normal bowel sounds, without guarding, and without rebound.   Neurologic:  Alert and  oriented x4;  grossly normal neurologically.  Impression/Plan: Kelsey Chavez is here for an colonoscopy to be performed for colon cancer screening.  Risks, benefits, limitations, and alternatives regarding  colonoscopy have been reviewed with the patient.  Questions have been answered.  All parties agreeable.   Gaylyn Cheers, MD  05/13/2019, 10:54 AM

## 2019-05-13 NOTE — H&P (Deleted)
Primary Care Physician:  Tracie Harrier, MD Primary Gastroenterologist:  Dr. Vira Agar  Pre-Procedure History & Physical: HPI:  Kelsey Chavez is a 53 y.o. female is here for an colonoscopy.   Past Medical History:  Diagnosis Date  . Depression   . Varicose veins of both lower extremities     Past Surgical History:  Procedure Laterality Date  . CESAREAN SECTION  1996  . CHOLECYSTECTOMY    . DIAGNOSTIC LAPAROSCOPY    . TONSILLECTOMY      Prior to Admission medications   Medication Sig Start Date End Date Taking? Authorizing Provider  calcium gluconate 500 MG tablet Take 1 tablet by mouth daily.   Yes [provider]  Multiple Vitamin (MULTIVITAMIN) tablet Take 1 tablet by mouth 3 (three) times daily.   Yes [provider]  omega-3 acid ethyl esters (LOVAZA) 1 g capsule Take 1 g by mouth daily.   Yes [provider]  phentermine 37.5 MG capsule Take 37.5 mg by mouth every morning.    [provider]    Allergies as of 04/26/2019 - Review Complete 07/27/2018  Allergen Reaction Noted  . Aspirin  03/25/2014    History reviewed. No pertinent family history.  Social History   Socioeconomic History  . Marital status: Legally Separated    Spouse name: Not on file  . Number of children: Not on file  . Years of education: Not on file  . Highest education level: Not on file  Occupational History  . Not on file  Social Needs  . Financial resource strain: Not on file  . Food insecurity    Worry: Not on file    Inability: Not on file  . Transportation needs    Medical: Not on file    Non-medical: Not on file  Tobacco Use  . Smoking status: Never Smoker  . Smokeless tobacco: Never Used  Substance and Sexual Activity  . Alcohol use: Yes    Alcohol/week: 4.0 standard drinks    Types: 2 Glasses of wine, 2 Cans of beer per week  . Drug use: No  . Sexual activity: Not on file  Lifestyle  . Physical activity    Days per week: Not  on file    Minutes per session: Not on file  . Stress: Not on file  Relationships  . Social Herbalist on phone: Not on file    Gets together: Not on file    Attends religious service: Not on file    Active member of club or organization: Not on file    Attends meetings of clubs or organizations: Not on file    Relationship status: Not on file  . Intimate partner violence    Fear of current or ex partner: Not on file    Emotionally abused: Not on file    Physically abused: Not on file    Forced sexual activity: Not on file  Other Topics Concern  . Not on file  Social History Narrative  . Not on file    Review of Systems: See HPI, otherwise negative ROS  Physical Exam: BP 106/85   Pulse 68   Temp 97.6 F (36.4 C) (Tympanic)   Resp 18   Ht 5' (1.524 m)   Wt 92.5 kg   SpO2 99%   BMI 39.84 kg/m  General:   Alert,  pleasant and cooperative in NAD Head:  Normocephalic and atraumatic. Neck:  Supple; no masses or thyromegaly. Lungs:  Clear throughout to auscultation.    Heart:  Regular rate and rhythm. Abdomen:  Soft, nontender and nondistended. Normal bowel sounds, without guarding, and without rebound.   Neurologic:  Alert and  oriented x4;  grossly normal neurologically.  Impression/Plan: Kelsey Chavez is here for an colonoscopy to be performed for colon cancer screening.  Risks, benefits, limitations, and alternatives regarding  colonoscopy have been reviewed with the patient.  Questions have been answered.  All parties agreeable.   Gaylyn Cheers, MD  05/13/2019, 10:50 AM

## 2019-05-14 ENCOUNTER — Encounter: Payer: Self-pay | Admitting: Unknown Physician Specialty

## 2019-05-20 LAB — SURGICAL PATHOLOGY

## 2019-09-09 ENCOUNTER — Other Ambulatory Visit: Payer: Self-pay | Admitting: Internal Medicine

## 2019-09-09 DIAGNOSIS — Z1231 Encounter for screening mammogram for malignant neoplasm of breast: Secondary | ICD-10-CM

## 2019-09-16 DIAGNOSIS — F330 Major depressive disorder, recurrent, mild: Secondary | ICD-10-CM | POA: Insufficient documentation

## 2019-09-16 DIAGNOSIS — R635 Abnormal weight gain: Secondary | ICD-10-CM | POA: Insufficient documentation

## 2019-09-20 ENCOUNTER — Other Ambulatory Visit
Admission: RE | Admit: 2019-09-20 | Discharge: 2019-09-20 | Disposition: A | Discharge status: Home / Self Care | Payer: BLUE CROSS/BLUE SHIELD | Source: Ambulatory Visit | Attending: Internal Medicine | Admitting: Internal Medicine

## 2019-09-20 ENCOUNTER — Ambulatory Visit: Admit: 2019-09-20 | Payer: BLUE CROSS/BLUE SHIELD | Admitting: General Surgery

## 2019-09-20 ENCOUNTER — Other Ambulatory Visit: Payer: Self-pay

## 2019-09-20 DIAGNOSIS — Z01812 Encounter for preprocedural laboratory examination: Secondary | ICD-10-CM | POA: Diagnosis not present

## 2019-09-20 DIAGNOSIS — Z20828 Contact with and (suspected) exposure to other viral communicable diseases: Secondary | ICD-10-CM | POA: Insufficient documentation

## 2019-09-20 LAB — SARS CORONAVIRUS 2 (TAT 6-24 HRS): SARS Coronavirus 2: NEGATIVE

## 2019-09-20 SURGERY — COLONOSCOPY WITH PROPOFOL
Anesthesia: General

## 2019-09-24 ENCOUNTER — Encounter: Payer: Self-pay | Admitting: *Deleted

## 2019-09-25 ENCOUNTER — Ambulatory Visit: Payer: BLUE CROSS/BLUE SHIELD | Admitting: Certified Registered Nurse Anesthetist

## 2019-09-25 ENCOUNTER — Ambulatory Visit
Admission: RE | Admit: 2019-09-25 | Discharge: 2019-09-25 | Disposition: A | Discharge status: Home / Self Care | Payer: BLUE CROSS/BLUE SHIELD | Attending: Internal Medicine | Admitting: Internal Medicine

## 2019-09-25 ENCOUNTER — Encounter
Admission: RE | Disposition: A | Discharge status: Home / Self Care | Payer: Self-pay | Source: Home / Self Care | Attending: Internal Medicine

## 2019-09-25 ENCOUNTER — Encounter: Payer: Self-pay | Admitting: *Deleted

## 2019-09-25 DIAGNOSIS — F329 Major depressive disorder, single episode, unspecified: Secondary | ICD-10-CM | POA: Diagnosis not present

## 2019-09-25 DIAGNOSIS — D123 Benign neoplasm of transverse colon: Secondary | ICD-10-CM | POA: Insufficient documentation

## 2019-09-25 DIAGNOSIS — K640 First degree hemorrhoids: Secondary | ICD-10-CM | POA: Diagnosis not present

## 2019-09-25 DIAGNOSIS — Z1211 Encounter for screening for malignant neoplasm of colon: Secondary | ICD-10-CM | POA: Diagnosis present

## 2019-09-25 DIAGNOSIS — Z79899 Other long term (current) drug therapy: Secondary | ICD-10-CM | POA: Insufficient documentation

## 2019-09-25 HISTORY — PX: COLONOSCOPY WITH PROPOFOL: SHX5780

## 2019-09-25 HISTORY — DX: Dizziness and giddiness: R42

## 2019-09-25 LAB — POCT PREGNANCY, URINE: Preg Test, Ur: NEGATIVE

## 2019-09-25 SURGERY — COLONOSCOPY WITH PROPOFOL
Anesthesia: General

## 2019-09-25 MED ORDER — PROPOFOL 500 MG/50ML IV EMUL
INTRAVENOUS | Status: DC | PRN
  Administered 2019-09-25: 140 ug/kg/min via INTRAVENOUS

## 2019-09-25 MED ORDER — LIDOCAINE HCL (CARDIAC) PF 100 MG/5ML IV SOSY
PREFILLED_SYRINGE | INTRAVENOUS | Status: DC | PRN
  Administered 2019-09-25: 50 mg via INTRATRACHEAL

## 2019-09-25 MED ORDER — PROPOFOL 10 MG/ML IV BOLUS
INTRAVENOUS | Status: DC | PRN
  Administered 2019-09-25: 50 mg via INTRAVENOUS

## 2019-09-25 MED ORDER — GLYCOPYRROLATE 0.2 MG/ML IJ SOLN
INTRAMUSCULAR | Status: DC | PRN
  Administered 2019-09-25: 0.2 mg via INTRAVENOUS

## 2019-09-25 MED ORDER — LIDOCAINE HCL (PF) 2 % IJ SOLN
INTRAMUSCULAR | Status: AC
  Filled 2019-09-25: qty 10

## 2019-09-25 MED ORDER — PROPOFOL 10 MG/ML IV BOLUS
INTRAVENOUS | Status: AC
  Filled 2019-09-25: qty 20

## 2019-09-25 MED ORDER — PHENYLEPHRINE HCL (PRESSORS) 10 MG/ML IV SOLN
INTRAVENOUS | Status: DC | PRN
  Administered 2019-09-25: 100 ug via INTRAVENOUS

## 2019-09-25 MED ORDER — SODIUM CHLORIDE 0.9 % IV SOLN
INTRAVENOUS | Status: DC
  Administered 2019-09-25: 13:00:00 via INTRAVENOUS

## 2019-09-25 NOTE — Anesthesia Post-op Follow-up Note (Signed)
Anesthesia QCDR form completed.        

## 2019-09-25 NOTE — Interval H&P Note (Signed)
History and Physical Interval Note:  09/25/2019 12:52 PM  Kelsey Chavez  has presented today for surgery, with the diagnosis of SERRATED ADENOMA OF COLON.  The various methods of treatment have been discussed with the patient and family. After consideration of risks, benefits and other options for treatment, the patient has consented to  Procedure(s): COLONOSCOPY WITH PROPOFOL (N/A) as a surgical intervention.  The patient's history has been reviewed, patient examined, no change in status, stable for surgery.  I have reviewed the patient's chart and labs.  Questions were answered to the patient's satisfaction.     [place], [place]

## 2019-09-25 NOTE — H&P (Signed)
Outpatient short stay form Pre-procedure 09/25/2019 12:51 PM Greenly Rarick K. Alice Reichert, M.D.  Primary Physician: Tracie Harrier, M.D.  Reason for visit: Colon cancer screening  History of present illness:  Patient presents for colonoscopy for colon cancer screening. The patient denies complaints of abdominal pain, significant change in bowel habits, or rectal bleeding.     Current Facility-Administered Medications:  .  0.9 %  sodium chloride infusion, , Intravenous, Continuous, Frieda Arnall, Benay Pike, MD  Medications Prior to Admission  Medication Sig Dispense Refill Last Dose  . buPROPion (WELLBUTRIN) 100 MG tablet Take 50 mg by mouth 2 (two) times daily.   09/24/2019 at Unknown time  . calcium gluconate 500 MG tablet Take 1 tablet by mouth daily.     . Multiple Vitamin (MULTIVITAMIN) tablet Take 1 tablet by mouth 3 (three) times daily.     Marland Kitchen omega-3 acid ethyl esters (LOVAZA) 1 g capsule Take 1 g by mouth daily.     . phentermine 37.5 MG capsule Take 37.5 mg by mouth every morning.   Not Taking at Unknown time     Allergies  Allergen Reactions  . Aspirin     Other reaction(s): Dizziness  . Shrimp [Shellfish Allergy] Itching     Past Medical History:  Diagnosis Date  . Depression   . Varicose veins of both lower extremities   . Vertigo     Review of systems:  Otherwise negative.    Physical Exam  Gen: Alert, oriented. Appears stated age.  HEENT: [place]/AT. PERRLA. Lungs: CTA, no wheezes. CV: RR nl S1, S2. Abd: soft, benign, no masses. BS+ Ext: No edema. Pulses 2+    Planned procedures: Proceed with colonoscopy. The patient understands the nature of the planned procedure, indications, risks, alternatives and potential complications including but not limited to bleeding, infection, perforation, damage to internal organs and possible oversedation/side effects from anesthesia. The patient agrees and gives consent to proceed.  Please refer to procedure notes for findings,  recommendations and patient disposition/instructions.     Rondal Vandevelde K. Alice Reichert, M.D. Gastroenterology 09/25/2019  12:51 PM

## 2019-09-25 NOTE — Transfer of Care (Signed)
Immediate Anesthesia Transfer of Care Note  Patient: Kelsey Chavez  Procedure(s) Performed: COLONOSCOPY WITH PROPOFOL (N/A )  Patient Location: PACU  Anesthesia Type:General  Level of Consciousness: sedated  Airway & Oxygen Therapy: Patient Spontanous Breathing and Patient connected to nasal cannula oxygen  Post-op Assessment: Report given to RN and Post -op Vital signs reviewed and stable  Post vital signs: Reviewed and stable  Last Vitals:  Vitals Value Taken Time  BP 88/55 09/25/19 1336  Temp    Pulse 67 09/25/19 1337  Resp 17 09/25/19 1337  SpO2 97 % 09/25/19 1337  Vitals shown include unvalidated device data.  Last Pain:  Vitals:   09/25/19 1336  TempSrc:   PainSc: 0-No pain         Complications: No apparent anesthesia complications

## 2019-09-25 NOTE — Anesthesia Preprocedure Evaluation (Signed)
Anesthesia Evaluation  Patient identified by MRN, date of birth, ID band Patient awake    Reviewed: Allergy & Precautions, NPO status , Patient's Chart, lab work & pertinent test results  History of Anesthesia Complications Negative for: history of anesthetic complications  Airway Mallampati: II  TM Distance: >3 FB Neck ROM: Full    Dental  (+) Partial Upper   Pulmonary neg pulmonary ROS, neg sleep apnea, neg COPD,    breath sounds clear to auscultation- rhonchi (-) wheezing      Cardiovascular Exercise Tolerance: Good (-) hypertension(-) CAD, (-) Past MI, (-) Cardiac Stents and (-) CABG  Rhythm:Regular Rate:Normal - Systolic murmurs and - Diastolic murmurs    Neuro/Psych neg Seizures PSYCHIATRIC DISORDERS Depression negative neurological ROS     GI/Hepatic negative GI ROS, Neg liver ROS,   Endo/Other  negative endocrine ROSneg diabetes  Renal/GU negative Renal ROS     Musculoskeletal negative musculoskeletal ROS (+)   Abdominal (+) + obese,   Peds  Hematology negative hematology ROS (+)   Anesthesia Other Findings Past Medical History: No date: Depression No date: Varicose veins of both lower extremities No date: Vertigo   Reproductive/Obstetrics                             Anesthesia Physical Anesthesia Plan  ASA: II  Anesthesia Plan: General   Post-op Pain Management:    Induction: Intravenous  PONV Risk Score and Plan: 2 and Propofol infusion  Airway Management Planned: Natural Airway  Additional Equipment:   Intra-op Plan:   Post-operative Plan:   Informed Consent: I have reviewed the patients History and Physical, chart, labs and discussed the procedure including the risks, benefits and alternatives for the proposed anesthesia with the patient or authorized representative who has indicated his/her understanding and acceptance.     Dental advisory given  Plan  Discussed with: CRNA and Anesthesiologist  Anesthesia Plan Comments:         Anesthesia Quick Evaluation

## 2019-09-25 NOTE — Anesthesia Postprocedure Evaluation (Signed)
Anesthesia Post Note  Patient: Kelsey Chavez  Procedure(s) Performed: COLONOSCOPY WITH PROPOFOL (N/A )  Patient location during evaluation: Endoscopy Anesthesia Type: General Level of consciousness: awake and alert and oriented Pain management: pain level controlled Vital Signs Assessment: post-procedure vital signs reviewed and stable Respiratory status: spontaneous breathing, nonlabored ventilation and respiratory function stable Cardiovascular status: blood pressure returned to baseline and stable Postop Assessment: no signs of nausea or vomiting Anesthetic complications: no     Last Vitals:  Vitals:   09/25/19 1346 09/25/19 1356  BP: 108/64 103/68  Pulse: 66 (!) 59  Resp: 19 12  Temp:    SpO2: 98% 99%    Last Pain:  Vitals:   09/25/19 1356  TempSrc:   PainSc: 0-No pain                 Daaiyah Baumert

## 2019-09-25 NOTE — Op Note (Signed)
[facility] Gastroenterology Patient Name: Kelsey Chavez Procedure Date: 09/25/2019 12:48 PM MRN: [id number] Account #: [id number] Date of Birth: 1966/11/11 Admit Type: Outpatient Age: 53 Room: [facility] ENDO ROOM 3 Gender: Female Note Status: Finalized Procedure:             Colonoscopy Indications:           Screening for colorectal malignant neoplasm Providers:             Benay Pike. [place] MD, MD Medicines:             Propofol per Anesthesia Complications:         No immediate complications. Procedure:             Pre-Anesthesia Assessment:                        - The risks and benefits of the procedure and the                         sedation options and risks were discussed with the                         patient. All questions were answered and informed                         consent was obtained.                        - Patient identification and proposed procedure were                         verified prior to the procedure by the nurse. The                         procedure was verified in the procedure room.                        - ASA Grade Assessment: III - A patient with severe                         systemic disease.                        - After reviewing the risks and benefits, the patient                         was deemed in satisfactory condition to undergo the                         procedure.                        After obtaining informed consent, the colonoscope was                         passed under direct vision. Throughout the procedure,                         the patient's blood pressure, pulse, and oxygen  saturations were monitored continuously. The                         Colonoscope was introduced through the anus and                         advanced to the the cecum, identified by appendiceal                         orifice and ileocecal valve. The colonoscopy was                         performed  without difficulty. The patient tolerated                         the procedure well. The quality of the bowel                         preparation was excellent. The ileocecal valve,                         appendiceal orifice, and rectum were photographed. Findings:      The perianal and digital rectal examinations were normal. Pertinent       negatives include normal sphincter tone and no palpable rectal lesions.      Non-bleeding internal hemorrhoids were found during retroflexion. The       hemorrhoids were Grade I (internal hemorrhoids that do not prolapse).      A 4 mm polyp was found in the transverse colon. The polyp was sessile.       The polyp was removed with a jumbo cold forceps. Resection and retrieval       were complete.      The exam was otherwise without abnormality on direct and retroflexion       views. Impression:            - Non-bleeding internal hemorrhoids.                        - One 4 mm polyp in the transverse colon, removed with                         a jumbo cold forceps. Resected and retrieved.                        - The examination was otherwise normal on direct and                         retroflexion views. Recommendation:        - Patient has a contact number available for                         emergencies. The signs and symptoms of potential                         delayed complications were discussed with the patient.                         Return to normal activities tomorrow. Written  discharge instructions were provided to the patient.                        - Resume previous diet.                        - Continue present medications.                        - Repeat colonoscopy is recommended for surveillance.                         The colonoscopy date will be determined after                         pathology results from today's exam become available                         for review.                        -  Return to GI office PRN.                        - The findings and recommendations were discussed with                         the patient. Procedure Code(s):     --- Professional ---                        [id number], Colonoscopy, flexible; with biopsy, single or                         multiple Diagnosis Code(s):     --- Professional ---                        K64.0, First degree hemorrhoids                        K63.5, Polyp of colon                        Z12.11, Encounter for screening for malignant neoplasm                         of colon CPT copyright 2019 American Medical Association. All rights reserved. The codes documented in this report are preliminary and upon coder review may  be revised to meet current compliance requirements. Efrain Sella MD, MD 09/25/2019 1:34:02 PM This report has been signed electronically. Number of Addenda: 0 Note Initiated On: 09/25/2019 12:48 PM Scope Withdrawal Time: 0 hours 6 minutes 47 seconds  Total Procedure Duration: 0 hours 9 minutes 20 seconds  Estimated Blood Loss:  Estimated blood loss: none.      [facility]

## 2019-09-26 ENCOUNTER — Encounter: Payer: Self-pay | Admitting: Internal Medicine

## 2019-09-27 LAB — SURGICAL PATHOLOGY

## 2019-10-03 ENCOUNTER — Ambulatory Visit
Admission: RE | Admit: 2019-10-03 | Discharge: 2019-10-03 | Disposition: A | Discharge status: Home / Self Care | Payer: BLUE CROSS/BLUE SHIELD | Source: Ambulatory Visit | Attending: Internal Medicine | Admitting: Internal Medicine

## 2019-10-03 DIAGNOSIS — Z1231 Encounter for screening mammogram for malignant neoplasm of breast: Secondary | ICD-10-CM | POA: Diagnosis present

## 2020-01-31 ENCOUNTER — Ambulatory Visit: Payer: Self-pay | Attending: Internal Medicine

## 2020-01-31 DIAGNOSIS — Z23 Encounter for immunization: Secondary | ICD-10-CM

## 2020-01-31 NOTE — Progress Notes (Signed)
   Covid-19 Vaccination Clinic  Name:  THEA TEESDALE    MRN: [id number] DOB: 12-May-1966  01/31/2020  Ms. Cousin was observed post Covid-19 immunization for 15 minutes without incident. She was provided with Vaccine Information Sheet and instruction to access the V-Safe system.   Ms. Jobin was instructed to call 911 with any severe reactions post vaccine: Marland Kitchen Difficulty breathing  . Swelling of face and throat  . A fast heartbeat  . A bad rash all over body  . Dizziness and weakness   Immunizations Administered    Name Date Dose VIS Date Route   Pfizer COVID-19 Vaccine 01/31/2020  2:56 PM 0.3 mL 10/25/2019 Intramuscular   Manufacturer: [place]   Lot: [id number]   [place]: [id number]

## 2020-02-21 ENCOUNTER — Ambulatory Visit: Payer: Self-pay | Attending: Internal Medicine

## 2020-02-21 DIAGNOSIS — Z23 Encounter for immunization: Secondary | ICD-10-CM

## 2020-02-21 NOTE — Progress Notes (Signed)
   Covid-19 Vaccination Clinic  Name:  Kelsey Chavez    MRN: [id number] DOB: 11/28/1965  02/21/2020  [name] was observed post Covid-19 immunization for 15 minutes without incident. She was provided with Vaccine Information Sheet and instruction to access the V-Safe system.   [name] was instructed to call 911 with any severe reactions post vaccine: Marland Kitchen Difficulty breathing  . Swelling of face and throat  . A fast heartbeat  . A bad rash all over body  . Dizziness and weakness   Immunizations Administered    Name Date Dose VIS Date Route   Pfizer COVID-19 Vaccine 02/21/2020  2:10 PM 0.3 mL 10/25/2019 Intramuscular   Manufacturer: [place]   Lot: [id number]   [place]: [id number]

## 2020-10-15 ENCOUNTER — Other Ambulatory Visit: Payer: Self-pay | Admitting: Internal Medicine

## 2020-10-15 DIAGNOSIS — Z1231 Encounter for screening mammogram for malignant neoplasm of breast: Secondary | ICD-10-CM

## 2020-11-14 DIAGNOSIS — C801 Malignant (primary) neoplasm, unspecified: Secondary | ICD-10-CM

## 2020-11-14 HISTORY — DX: Malignant (primary) neoplasm, unspecified: C80.1

## 2021-01-20 ENCOUNTER — Other Ambulatory Visit: Payer: Self-pay

## 2021-01-20 ENCOUNTER — Ambulatory Visit
Admission: RE | Admit: 2021-01-20 | Discharge: 2021-01-20 | Disposition: A | Discharge status: Home / Self Care | Payer: 59 | Source: Ambulatory Visit | Attending: Internal Medicine | Admitting: Internal Medicine

## 2021-01-20 DIAGNOSIS — Z1231 Encounter for screening mammogram for malignant neoplasm of breast: Secondary | ICD-10-CM | POA: Diagnosis present

## 2021-01-27 ENCOUNTER — Other Ambulatory Visit: Payer: Self-pay | Admitting: Internal Medicine

## 2021-01-27 DIAGNOSIS — N6489 Other specified disorders of breast: Secondary | ICD-10-CM

## 2021-01-27 DIAGNOSIS — R928 Other abnormal and inconclusive findings on diagnostic imaging of breast: Secondary | ICD-10-CM

## 2021-01-29 ENCOUNTER — Other Ambulatory Visit: Payer: 59

## 2021-01-29 ENCOUNTER — Ambulatory Visit: Payer: 59

## 2021-02-12 ENCOUNTER — Ambulatory Visit
Admission: RE | Admit: 2021-02-12 | Discharge: 2021-02-12 | Disposition: A | Discharge status: Home / Self Care | Payer: 59 | Source: Ambulatory Visit | Attending: Internal Medicine | Admitting: Internal Medicine

## 2021-02-12 ENCOUNTER — Other Ambulatory Visit: Payer: Self-pay

## 2021-02-12 DIAGNOSIS — R928 Other abnormal and inconclusive findings on diagnostic imaging of breast: Secondary | ICD-10-CM | POA: Diagnosis present

## 2021-02-12 DIAGNOSIS — N6489 Other specified disorders of breast: Secondary | ICD-10-CM | POA: Insufficient documentation

## 2021-02-16 ENCOUNTER — Other Ambulatory Visit: Payer: Self-pay | Admitting: Internal Medicine

## 2021-02-16 DIAGNOSIS — R928 Other abnormal and inconclusive findings on diagnostic imaging of breast: Secondary | ICD-10-CM

## 2021-02-16 DIAGNOSIS — N6310 Unspecified lump in the right breast, unspecified quadrant: Secondary | ICD-10-CM

## 2021-02-24 ENCOUNTER — Ambulatory Visit
Admission: RE | Admit: 2021-02-24 | Discharge: 2021-02-24 | Disposition: A | Discharge status: Home / Self Care | Payer: 59 | Source: Ambulatory Visit | Attending: Internal Medicine | Admitting: Internal Medicine

## 2021-02-24 ENCOUNTER — Other Ambulatory Visit: Payer: Self-pay

## 2021-02-24 DIAGNOSIS — N6310 Unspecified lump in the right breast, unspecified quadrant: Secondary | ICD-10-CM | POA: Insufficient documentation

## 2021-02-24 DIAGNOSIS — R928 Other abnormal and inconclusive findings on diagnostic imaging of breast: Secondary | ICD-10-CM | POA: Diagnosis not present

## 2021-02-24 HISTORY — PX: BREAST BIOPSY: SHX20

## 2021-02-25 ENCOUNTER — Encounter: Payer: Self-pay | Admitting: *Deleted

## 2021-02-25 DIAGNOSIS — C50911 Malignant neoplasm of unspecified site of right female breast: Secondary | ICD-10-CM

## 2021-02-25 NOTE — Progress Notes (Signed)
Received message from Electa Sniff, RN today that patient had been informed of her new diagnosis of invasive right breast cancer.  Called patient to establish navigation services.  She and her daughter were on the phone.  The patient speaks English but wanted her daughter to hear and ask questions.  States she would like to see Dr. Bary Castilla.  I have talked with Dr. Dwyane Luo office.  If patient needs an interpreter, it may be weeks before a [facility] interpreter is available to assist.  Dr. Bary Castilla is willing to see the patient with her daughter or offered if she preferred she could see Dr. Peyton Najjar who speaks Spanish.   I discussed this with patient.  She is going to think about it and call me back.

## 2021-03-01 ENCOUNTER — Encounter: Payer: Self-pay | Admitting: *Deleted

## 2021-03-01 NOTE — Progress Notes (Signed)
Spoke to patient today.  She has been scheduled to see Dr. Rogue Bussing on Thursday 4/21 at 8:30 and Dr. Bary Castilla on Wednesday 4/20 @ 9:00.  Patient to receive educational material at one of the appointments.

## 2021-03-02 LAB — SURGICAL PATHOLOGY

## 2021-03-03 ENCOUNTER — Encounter: Payer: Self-pay | Admitting: *Deleted

## 2021-03-03 ENCOUNTER — Other Ambulatory Visit: Payer: Self-pay | Admitting: *Deleted

## 2021-03-04 ENCOUNTER — Inpatient Hospital Stay: Payer: 59 | Attending: Internal Medicine | Admitting: Internal Medicine

## 2021-03-04 ENCOUNTER — Encounter: Payer: Self-pay | Admitting: *Deleted

## 2021-03-04 ENCOUNTER — Encounter (INDEPENDENT_AMBULATORY_CARE_PROVIDER_SITE_OTHER): Payer: Self-pay

## 2021-03-04 ENCOUNTER — Encounter: Payer: Self-pay | Admitting: Internal Medicine

## 2021-03-04 DIAGNOSIS — C50411 Malignant neoplasm of upper-outer quadrant of right female breast: Secondary | ICD-10-CM | POA: Insufficient documentation

## 2021-03-04 DIAGNOSIS — Z170 Estrogen receptor positive status [ER+]: Secondary | ICD-10-CM | POA: Diagnosis not present

## 2021-03-04 NOTE — Progress Notes (Signed)
[facility] [place] NOTE  Patient Care Team: Tracie Harrier, MD as PCP - General (Internal Medicine) Rico Junker, RN as Oncology Nurse Navigator  CHIEF COMPLAINTS/PURPOSE OF CONSULTATION: Breast cancer  #  Oncology History Overview Note  # RIGHT BREAST CA- cT1b [62m]c N0; ER/PR- POSITIVE  [>90%]; her 2 NEG; GRADE-1 [Dr.Byrnett]     Carcinoma of upper-outer quadrant of right breast in female, estrogen receptor positive ([place]  03/04/2021 Initial Diagnosis   Carcinoma of upper-outer quadrant of right breast in female, estrogen receptor positive ([place]   03/04/2021 Cancer Staging   Staging form: Breast, AJCC 8th Edition - Clinical: Stage IA (cT1b, cN0, cM0, G1, ER+, PR+, HER2-) - Signed by BCammie Sickle MD on 03/04/2021 Histologic grading system: 3 grade system      HISTORY OF PRESENTING ILLNESS:  [name]89y.o.  female female with no prior history of breast cancer/or malignancies [place] for new diagnosis of breast cancer.   Patient states she was found to have an abnormal screening mammogram which led to diagnostic mammogram/ultrasound/followed by biopsy-as summarized above.  Patient is perimenopausal.  Otherwise denies any worsening joint pains bone pain.  Denies any other symptoms of headache nausea vomiting shortness of breath or cough.  Review of Systems  Constitutional: Negative for chills, diaphoresis, fever, malaise/fatigue and weight loss.  HENT: Negative for nosebleeds and sore throat.   Eyes: Negative for double vision.  Respiratory: Negative for cough, hemoptysis, sputum production, shortness of breath and wheezing.   Cardiovascular: Negative for chest pain, palpitations, orthopnea and leg swelling.  Gastrointestinal: Negative for abdominal pain, blood in stool, constipation, diarrhea, heartburn, melena, nausea and vomiting.  Genitourinary: Negative for dysuria, frequency and  urgency.  Musculoskeletal: Negative for back pain and joint pain.  Skin: Negative.  Negative for itching and rash.  Neurological: Negative for dizziness, tingling, focal weakness, weakness and headaches.  Endo/Heme/Allergies: Does not bruise/bleed easily.  Psychiatric/Behavioral: Negative for depression. The patient is not nervous/anxious and does not have insomnia.      MEDICAL HISTORY:  Past Medical History:  Diagnosis Date  . Depression   . Varicose veins of both lower extremities   . Vertigo     SURGICAL HISTORY: Past Surgical History:  Procedure Laterality Date  . BREAST BIOPSY Right 02/24/2021   u/s bx "  . CESAREAN SECTION  1996  . CHOLECYSTECTOMY    . COLONOSCOPY WITH PROPOFOL N/A 05/13/2019   Procedure: COLONOSCOPY WITH PROPOFOL;  Surgeon: EManya Silvas MD;  [facility];  Service: Endoscopy;  Laterality: N/A;  . COLONOSCOPY WITH PROPOFOL N/A 09/25/2019   Procedure: COLONOSCOPY WITH PROPOFOL;  Surgeon: Toledo, TBenay Pike MD;  Location: [facility] ENDOSCOPY;  Service: Gastroenterology;  Laterality: N/A;  . DIAGNOSTIC LAPAROSCOPY    . TONSILLECTOMY      SOCIAL HISTORY: Social History   Socioeconomic History  . Marital status: Legally Separated    Spouse name: Not on file  . Number of children: Not on file  . Years of education: Not on file  . Highest education level: Not on file  Occupational History  . Not on file  Tobacco Use  . Smoking status: Never Smoker  . Smokeless tobacco: Never Used  Vaping Use  . Vaping Use: Never used  Substance and Sexual Activity  . Alcohol use: Yes    Alcohol/week: 4.0 standard drinks    Types: 2 Glasses of wine, 2 Cans of beer per week  .  Drug use: No  . Sexual activity: Not on file  Other Topics Concern  . Not on file  Social History Narrative   Works at [employer]; lives in [place] with daughters; never smoked; no alcohol.   Social Determinants of Health   Financial Resource Strain: Not on file  Food  Insecurity: Not on file  Transportation Needs: Not on file  Physical Activity: Not on file  Stress: Not on file  Social Connections: Not on file  Intimate Partner Violence: Not on file    FAMILY HISTORY: Family History  Problem Relation Age of Onset  . Breast cancer Neg Hx     ALLERGIES:  is allergic to aspirin, shrimp extract allergy skin test, and shrimp [shellfish allergy].  MEDICATIONS:  Current Outpatient Medications  Medication Sig Dispense Refill  . buPROPion (WELLBUTRIN XL) 150 MG 24 hr tablet Take 1 tablet by mouth daily.    . Semaglutide (RYBELSUS) 14 MG TABS Take 1 tablet by mouth daily.     No current facility-administered medications for this visit.      .  PHYSICAL EXAMINATION: ECOG PERFORMANCE STATUS: 0 - Asymptomatic  Vitals:   03/04/21 0829  BP: 102/65  Pulse: 65  Resp: 16  Temp: (!) 96.8 F (36 C)  SpO2: 100%   Filed Weights   03/04/21 0829  Weight: 158 lb (71.7 kg)    Physical Exam HENT:     Head: Normocephalic and atraumatic.     Mouth/Throat:     Pharynx: No oropharyngeal exudate.  Eyes:     Pupils: Pupils are equal, round, and reactive to light.  Cardiovascular:     Rate and Rhythm: Normal rate and regular rhythm.  Pulmonary:     Effort: Pulmonary effort is normal. No respiratory distress.     Breath sounds: Normal breath sounds. No wheezing.  Abdominal:     General: Bowel sounds are normal. There is no distension.     Palpations: Abdomen is soft. There is no mass.     Tenderness: There is no abdominal tenderness. There is no guarding or rebound.  Musculoskeletal:        General: No tenderness. Normal range of motion.     Cervical back: Normal range of motion and neck supple.  Skin:    General: Skin is warm.  Neurological:     Mental Status: She is alert and oriented to person, place, and time.  Psychiatric:        Mood and Affect: Affect normal.      LABORATORY DATA:  I have reviewed the data as listed No results found  for: WBC, HGB, HCT, MCV, PLT No results for input(s): NA, K, CL, CO2, GLUCOSE, BUN, CREATININE, CALCIUM, GFRNONAA, GFRAA, PROT, ALBUMIN, AST, ALT, ALKPHOS, BILITOT, BILIDIR, IBILI in the last 8760 hours.  RADIOGRAPHIC STUDIES: I have personally reviewed the radiological images as listed and agreed with the findings in the report. US BREAST LTD UNI RIGHT INC AXILLA  Result Date: 02/12/2021 CLINICAL DATA:  Screening recall for a right breast asymmetry EXAM: DIGITAL DIAGNOSTIC UNILATERAL RIGHT MAMMOGRAM WITH TOMOSYNTHESIS AND CAD; ULTRASOUND RIGHT BREAST LIMITED TECHNIQUE: Right digital diagnostic mammography and breast tomosynthesis was performed. The images were evaluated with computer-aided detection.; Targeted ultrasound examination of the right breast was performed COMPARISON:  Previous exam(s). ACR Breast Density Category b: There are scattered areas of fibroglandular density. FINDINGS: Spot compression tomosynthesis images through the lateral to upper-outer right breast demonstrates a persistent focal asymmetry measuring approximately 7 mm. Ultrasound targeted to   the right breast at 10 o'clock, 4 cm from the nipple demonstrates an irregular hypoechoic vascular mass with indistinct margins measuring 9 x 5 x 7 mm. Ultrasound of the right axilla demonstrates multiple normal-appearing lymph nodes. IMPRESSION: 1. There is a suspicious 9 mm mass in the right breast at 10 o'clock. 2.  No evidence of right axillary lymphadenopathy. RECOMMENDATION: Ultrasound guided biopsy is recommended for the right breast mass. Close attention on the post biopsy mammogram is recommended to ensure that the mass corresponds with the asymmetry. The patient will be contacted by our scheduler to schedule the biopsy at her earliest convenience. I have discussed the findings and recommendations with the patient. If applicable, a reminder letter will be sent to the patient regarding the next appointment. BI-RADS CATEGORY  5: Highly  suggestive of malignancy. Electronically Signed   By: Ammie Ferrier M.D.   On: 02/12/2021 10:13   MM DIAG BREAST TOMO UNI RIGHT  Result Date: 02/12/2021 CLINICAL DATA:  Screening recall for a right breast asymmetry EXAM: DIGITAL DIAGNOSTIC UNILATERAL RIGHT MAMMOGRAM WITH TOMOSYNTHESIS AND CAD; ULTRASOUND RIGHT BREAST LIMITED TECHNIQUE: Right digital diagnostic mammography and breast tomosynthesis was performed. The images were evaluated with computer-aided detection.; Targeted ultrasound examination of the right breast was performed COMPARISON:  Previous exam(s). ACR Breast Density Category b: There are scattered areas of fibroglandular density. FINDINGS: Spot compression tomosynthesis images through the lateral to upper-outer right breast demonstrates a persistent focal asymmetry measuring approximately 7 mm. Ultrasound targeted to the right breast at 10 o'clock, 4 cm from the nipple demonstrates an irregular hypoechoic vascular mass with indistinct margins measuring 9 x 5 x 7 mm. Ultrasound of the right axilla demonstrates multiple normal-appearing lymph nodes. IMPRESSION: 1. There is a suspicious 9 mm mass in the right breast at 10 o'clock. 2.  No evidence of right axillary lymphadenopathy. RECOMMENDATION: Ultrasound guided biopsy is recommended for the right breast mass. Close attention on the post biopsy mammogram is recommended to ensure that the mass corresponds with the asymmetry. The patient will be contacted by our scheduler to schedule the biopsy at her earliest convenience. I have discussed the findings and recommendations with the patient. If applicable, a reminder letter will be sent to the patient regarding the next appointment. BI-RADS CATEGORY  5: Highly suggestive of malignancy. Electronically Signed   By: Ammie Ferrier M.D.   On: 02/12/2021 10:13   MM CLIP PLACEMENT RIGHT  Result Date: 02/24/2021 CLINICAL DATA:  Status post ultrasound-guided core biopsy of a right breast mass. EXAM:  3D DIAGNOSTIC RIGHT MAMMOGRAM POST ULTRASOUND BIOPSY COMPARISON:  Previous exam(s). FINDINGS: 3D Mammographic images were obtained following ultrasound guided biopsy of the right. The biopsy marking clip is in the upper-outer quadrant of the right breast. IMPRESSION: Appropriate positioning of the Q shaped biopsy marking clip at the site of biopsy in the upper-outer quadrant of the right breast. Final Assessment: Post Procedure Mammograms for Marker Placement Electronically Signed   By: Lillia Mountain M.D.   On: 02/24/2021 09:27   [place] RT BREAST BX W LOC DEV 1ST LESION IMG BX SPEC US GUIDE  Addendum Date: 03/01/2021   ADDENDUM REPORT: 02/26/2021 14:04 ADDENDUM: PATHOLOGY revealed: A. BREAST, RIGHT AT 10:00, 4 CM FROM THE NIPPLE; ULTRASOUND-GUIDED CORE NEEDLE BIOPSY: - INVASIVE MAMMARY CARCINOMA, NO SPECIAL TYPE. Size of invasive carcinoma: 7 mm in this sample. Grade 1. Ductal carcinoma in situ: Present, low-grade. Lymphovascular invasion: Not identified. Pathology results are CONCORDANT with imaging findings, per Dr. Lillia Mountain. Pathology results  and recommendations below were discussed with patient by telephone on 02/25/2021. Patient reported biopsy site within normal limits with slight tenderness at the site, and no significant bruising. Post biopsy care instructions were reviewed, questions were answered and my direct phone number was provided to patient. Patient was instructed to call [facility] if any concerns or questions arise related to the biopsy. Recommend surgical consultation: Request for surgical consultation relayed to Anne Shaver RN and Sheena Lambert RN at [facility] by Linda Nash RN on 02/25/2021. Pathology results reported by Linda Nash RN on 02/26/2021. Electronically Signed   By: Dina  Arceo M.D.   On: 02/26/2021 14:04   Result Date: 03/01/2021 CLINICAL DATA:  Right breast mass. EXAM: ULTRASOUND GUIDED RIGHT BREAST CORE NEEDLE BIOPSY COMPARISON:  Previous exam(s).  PROCEDURE: I met with the patient and we discussed the procedure of ultrasound-guided biopsy, including benefits and alternatives. We discussed the high likelihood of a successful procedure. We discussed the risks of the procedure, including infection, bleeding, tissue injury, clip migration, and inadequate sampling. Informed written consent was given. The usual time-out protocol was performed immediately prior to the procedure. Lesion quadrant: Upper-outer quadrant Using sterile technique and 1% lidocaine and 1% lidocaine with epinephrine as local anesthetic, under direct ultrasound visualization, a 12 gauge spring-loaded device was used to perform biopsy of a mass in the 10 o'clock region of the right breast using a lateral to medial approach. At the conclusion of the procedure Q shaped tissue marker clip was deployed into the biopsy cavity. Follow up 2 view mammogram was performed and dictated separately. IMPRESSION: Ultrasound guided biopsy of the right breast. No apparent complications. Electronically Signed: By: Dina  Arceo M.D. On: 02/24/2021 09:18    ASSESSMENT & PLAN:   Carcinoma of upper-outer quadrant of right breast in female, estrogen receptor positive (HCC) #Clinical stage I-breast cancer ER/PR positive HER2 negative [grade 1;cT9mm].  Given stage I cancer the goal of treatment is cure.  # I had a long discussion with the patient in general regarding the treatment options of breast cancer including-surgery; adjuvant radiation; role of adjuvant systemic therapy including-chemotherapy antihormone therapy.   #I discussed the surgical options include lumpectomy with radiation versus mastectomy.  Patient understands that both modalities are equally efficacious.  Also discussed option of brachytherapy versus external beam radiation.  This will be decided post surgery.  #With regards to systemic therapy patient will benefit from endocrine therapy given ER/PR positive disease.  Given stage I/grade  1-less likely patient will need any systemic chemotherapy.  However, decisions on final systemic therapy based upon final surgery/pathology report.  # GENETICS: Discussed regarding genetic predisposition versus sporadic nature of breast cancer. we will make a referral to genetics down the line.  Thank you Drs.Hande and Byrnett for allowing me to participate in the care of your pleasant patient. Please do not hesitate to contact me with questions or concerns in the interim.  Discussed with Dr. Byrnett; and Sheena.   # DISPOSITION: # follow up TBD-Dr.B    All questions were answered. The patient/family knows to call the clinic with any problems, questions or concerns.       Kelsey R Brahmanday, MD 03/04/2021 9:45 AM     

## 2021-03-04 NOTE — Assessment & Plan Note (Addendum)
#  Clinical stage I-breast cancer ER/PR positive HER2 negative [grade 1;cT42mm].  Given stage I cancer the goal of treatment is cure.  # I had a long discussion with the patient in general regarding the treatment options of breast cancer including-surgery; adjuvant radiation; role of adjuvant systemic therapy including-chemotherapy antihormone therapy.   #I discussed the surgical options include lumpectomy with radiation versus mastectomy.  Patient understands that both modalities are equally efficacious.  Also discussed option of brachytherapy versus external beam radiation.  This will be decided post surgery.  #With regards to systemic therapy patient will benefit from endocrine therapy given ER/PR positive disease.  Given stage I/grade 1-less likely patient will need any systemic chemotherapy.  However, decisions on final systemic therapy based upon final surgery/pathology report.  # GENETICS: Discussed regarding genetic predisposition versus sporadic nature of breast cancer. we will make a referral to genetics down the line.  Thank you Drs.Hande and Byrnett for allowing me to participate in the care of your pleasant patient. Please do not hesitate to contact me with questions or concerns in the interim.  Discussed with Dr. Bary Castilla; and Vita Barley.   # DISPOSITION: # follow up TBD-Dr.B

## 2021-03-04 NOTE — Progress Notes (Signed)
Met patient and her daughter, Kelsey Chavez today during her initial medical oncology consult with Dr. Rogue Bussing.  Patient is to schedule her surgery and then follow up with Dr. Rogue Bussing 2 weeks later for final treatment plan.  Most likely will require radiation therapy and antihormonal therapy.  Chemotherapy decision is pending post surgery pathology.  Patient was encouraged to call with her surgery date and I will schedule her follow up appointment. Gave patient breast cancer educational literature, "My Breast Cancer Treatment Handbook" by Josephine Igo, RN.

## 2021-03-09 ENCOUNTER — Other Ambulatory Visit: Payer: Self-pay | Admitting: General Surgery

## 2021-03-09 ENCOUNTER — Other Ambulatory Visit (INDEPENDENT_AMBULATORY_CARE_PROVIDER_SITE_OTHER): Payer: Self-pay | Admitting: Nurse Practitioner

## 2021-03-09 DIAGNOSIS — C50411 Malignant neoplasm of upper-outer quadrant of right female breast: Secondary | ICD-10-CM

## 2021-03-09 DIAGNOSIS — I83811 Varicose veins of right lower extremities with pain: Secondary | ICD-10-CM

## 2021-03-09 DIAGNOSIS — Z170 Estrogen receptor positive status [ER+]: Secondary | ICD-10-CM

## 2021-03-09 NOTE — Progress Notes (Signed)
Subjective:     Patient ID: Kelsey Chavez is a 55 y.o. female.  HPI  The following portions of the patient's history were reviewed and updated as appropriate.  This an established patient is here today for: office visit. The patient has been referred today by Dr. Ginette Pitman for evaluation of a newly diagnosed right breast cancer. The patient had a biopsy done on 02-24-21. Patient reports she has yearly mammograms. She states she did not feel a lump prior to her recent mammogram. The patient denies any trauma to the breast.   The patient has had no previous breast issues and has no family history of breast cancer.  She nursed her 81 children aged 75, 80 and 32.  The patient reports her bra size is a 40 A.  The patient is accompanied today by her daughter, Maudie Mercury.        Chief Complaint  Patient presents with  . Treatment Plan Discussion    Newly diagnosed right breast cancer     BP 104/72   Pulse 71   Temp 37 C (98.6 F)   Ht 152.4 cm (5')   Wt 75.8 kg (167 lb)   LMP 01/31/2021 (Approximate)   SpO2 98%   BMI 32.61 kg/m       Past Medical History:  Diagnosis Date  . Depression   . Unspecified symptom associated with female genital organs   . Varicose veins of both lower extremities   . Vertigo           Past Surgical History:  Procedure Laterality Date  . BREAST EXCISIONAL BIOPSY Right 02/24/2021  . CESAREAN SECTION     1996  . COLONOSCOPY  05/13/2019   Sessile Serrated Polyp w/ Dysplasia: [facility] 08/2019 Dr Vira Agar  . COLONOSCOPY  09/25/2019   Sessile serrated polyp/Repeat 40yr/TKT Dr TAlice Reichert . LAPAROSCOPIC CHOLECYSTECTOMY    . LAPAROSCOPY DIAGNOSTIC    . TONSILLECTOMY                OB History    Gravida  4   Para  3   Term      Preterm      AB      Living        SAB      IAB      Ectopic      Molar      Multiple      Live Births          Obstetric Comments  Age at first period 89Age of first  pregnancy 89        Social History         Socioeconomic History  . Marital status: Divorced  . Number of children: 3  . Years of education: 9  Tobacco Use  . Smoking status: Never Smoker  . Smokeless tobacco: Never Used  Vaping Use  . Vaping Use: Never used  Substance and Sexual Activity  . Alcohol use: No  . Drug use: No  . Sexual activity: Defer            Allergies  Allergen Reactions  . Aspirin Dizziness    Other reaction(s): Dizziness     . Shellfish Containing Products Itching  . Shrimp Itching    Current Medications        Current Outpatient Medications  Medication Sig Dispense Refill  . buPROPion (WELLBUTRIN XL) 150 MG XL tablet Take 1 tablet (150 mg total) by mouth once daily 30  tablet 5  . RYBELSUS 14 mg Tab Take 1 tablet by mouth once daily 30 tablet 5   No current facility-administered medications for this visit.           Family History  Problem Relation Age of Onset  . High blood pressure (Hypertension) Mother   . Liver disease Father     Review of Systems  Constitutional: Negative for chills and fever.  Respiratory: Negative for cough.        Objective:   Physical Exam Exam conducted with a chaperone present.  Constitutional:      Appearance: Normal appearance.  Cardiovascular:     Rate and Rhythm: Normal rate and regular rhythm.     Pulses: Normal pulses.     Heart sounds: Normal heart sounds.  Pulmonary:     Effort: Pulmonary effort is normal.     Breath sounds: Normal breath sounds.  Musculoskeletal:     Cervical back: Neck supple.  Skin:    General: Skin is warm and dry.  Neurological:     Mental Status: She is alert and oriented to person, place, and time.  Psychiatric:        Mood and Affect: Mood normal.        Behavior: Behavior normal.      Labs and Radiology:    October 03, 2019 through February 24, 2021 mammogram/ultrasound review:  These imaging studies were independently  reviewed.  New developing density in the upper outer quadrant of the right breast.  Solid on ultrasound.   February 24, 2021 pathology:  DIAGNOSIS:  A. BREAST, RIGHT AT 10:00, 4 CM FROM THE NIPPLE; ULTRASOUND-GUIDED CORE  NEEDLE BIOPSY:  - INVASIVE MAMMARY CARCINOMA, NO SPECIAL TYPE.   Size of invasive carcinoma: 7 mm in this sample  Histologic grade of invasive carcinoma: Grade 1            Glandular/tubular differentiation score: 2            Nuclear pleomorphism score: 2            Mitotic rate score: 1            Total score: 5  Ductal carcinoma in situ: Present, low-grade  Lymphovascular invasion: Not identified   Estrogen Receptor (ER) Status: POSITIVE      Percentage of cells with nuclear positivity: Greater than 90%      Average intensity of staining: Strong   Progesterone Receptor (PgR) Status: POSITIVE      Percentage of cells with nuclear positivity: Greater than 90%      Average intensity of staining: Strong   HER2 (by immunohistochemistry): NEGATIVE (Score 1+)   Office ultrasound:  Ultrasound examination of the upper outer quadrant of the right breast was undertaken to determine if preoperative localization would be required.  In the 10 o'clock position, 4 cm from the nipple a well identified 0.54 x 0.59 x 0.63 hypoechoic mass with evidence of an intraluminal clip is evident.  This is 1.3 cm below the skin.  BI-RADS-numeral 6.  February 10, 2021 laboratory review:  WBC (White Blood Cell Count) 4.1 - 10.2 10^3/uL 5.0   RBC (Red Blood Cell Count) 4.04 - 5.48 10^6/uL 4.19   Hemoglobin 12.0 - 15.0 gm/dL 13.2   Hematocrit 35.0 - 47.0 % 39.6   MCV (Mean Corpuscular Volume) 80.0 - 100.0 fl 94.5   MCH (Mean Corpuscular Hemoglobin) 27.0 - 31.2 pg 31.5High   MCHC (Mean Corpuscular Hemoglobin Concentration) 32.0 - 36.0 gm/dL 33.3  Platelet Count 150 - 450 10^3/uL 259   RDW-CV (Red Cell Distribution Width)  11.6 - 14.8 % 13.2   MPV (Mean Platelet Volume) 9.4 - 12.4 fl 9.8   Neutrophils 1.50 - 7.80 10^3/uL 2.83   Lymphocytes 1.00 - 3.60 10^3/uL 1.68   Monocytes 0.00 - 1.50 10^3/uL 0.34   Eosinophils 0.00 - 0.55 10^3/uL 0.13   Basophils 0.00 - 0.09 10^3/uL 0.02   Neutrophil % 32.0 - 70.0 % 56.6   Lymphocyte % 10.0 - 50.0 % 33.6   Monocyte % 4.0 - 13.0 % 6.8   Eosinophil % 1.0 - 5.0 % 2.6   Basophil% 0.0 - 2.0 % 0.4   Immature Granulocyte % <=0.7 % 0.0   Immature Granulocyte Count <=0.06 10^3/L 0.00    Hemoglobin A1C 4.2 - 5.6 % 5.0    Glucose 70 - 110 mg/dL 84   Sodium 136 - 145 mmol/L 140   Potassium 3.6 - 5.1 mmol/L 4.2   Chloride 97 - 109 mmol/L 104   Carbon Dioxide (CO2) 22.0 - 32.0 mmol/L 29.9   Urea Nitrogen (BUN) 7 - 25 mg/dL 16   Creatinine 0.6 - 1.1 mg/dL 0.7   Glomerular Filtration Rate (eGFR), MDRD Estimate >60 mL/min/1.73sq m 88   Calcium 8.7 - 10.3 mg/dL 8.9   AST  8 - 39 U/L 22   ALT  5 - 38 U/L 27   Alk Phos (alkaline Phosphatase) 34 - 104 U/L 111High   Albumin 3.5 - 4.8 g/dL 4.1   Bilirubin, Total 0.3 - 1.2 mg/dL 0.6   Protein, Total 6.1 - 7.9 g/dL 6.4   A/G Ratio 1.0 - 5.0 gm/dL 1.8        Assessment:     Stage I carcinoma of the right breast, ER/PR positive.    Plan:     Options for breast cancer management were reviewed in detail with the patient and her middle child, Maudie Mercury.  She is a candidate for breast conservation with postoperative radiation versus mastectomy.  She does have a modest breast volume (A cup), but certainly this is not a contraindication to breast conservation.  Her eyes did brighten a little bit with talking a mastectomy with reconstruction, and she was asking whether the other breast could be brought back up to its normal location.  She may want to investigate plastic surgery evaluation prior to surgical intervention.  Anticipate that she will have a good outcome.  The role for antiestrogen therapy after surgery was touched upon.   Based on information available to date, I would not see that she be a candidate for adjuvant chemotherapy and certainly not a candidate for neoadjuvant treatment.  She does make use of Wellbutrin for anxiety, and has done very well with this.  This would make estrogen blockade a little more difficult as she is premenopausal and tamoxifen is deactivated in the face of Wellbutrin.  She has an appointment tomorrow with medical oncology.  She has been given [email or web address] as a good website to review as well as an Materials engineer on surgical options.  The opportunity for second surgical opinion was reviewed and both breast conservation and mastectomy were presented as therapeutically equivalent.  The patient is a driver for [employer], and she was concerned about return to work.  I think with either procedure, excluding reconstruction, she probably be out 10 to 14 days and then would be able to resume her responsibilities.    This note is partially prepared by Ledell Noss, CMA acting  as a scribe in the presence of Dr. Hervey Ard, MD.   The documentation recorded by the scribe accurately reflects the service I personally performed and the decisions made by me.   The patient did have questions about cost.  Breast conservation is inherently more expensive based on the radiation.  She can discuss with her insurance carrier specific deductible amounts if this would impact her decision tree.  More than 50% of the one-hour visit was spent reviewing treatment options and management.  Robert Bellow, MD FACS      Electronically signed by Mayer Masker, MD on 03/03/2021 10:12 AM

## 2021-03-10 ENCOUNTER — Other Ambulatory Visit: Payer: Self-pay

## 2021-03-10 ENCOUNTER — Ambulatory Visit (INDEPENDENT_AMBULATORY_CARE_PROVIDER_SITE_OTHER): Payer: 59 | Admitting: Nurse Practitioner

## 2021-03-10 ENCOUNTER — Encounter (INDEPENDENT_AMBULATORY_CARE_PROVIDER_SITE_OTHER): Payer: Self-pay | Admitting: Nurse Practitioner

## 2021-03-10 ENCOUNTER — Ambulatory Visit (INDEPENDENT_AMBULATORY_CARE_PROVIDER_SITE_OTHER): Payer: 59

## 2021-03-10 VITALS — BP 96/61 | HR 60 | Resp 16 | Ht 60.0 in | Wt 167.0 lb

## 2021-03-10 DIAGNOSIS — M79605 Pain in left leg: Secondary | ICD-10-CM | POA: Diagnosis not present

## 2021-03-10 DIAGNOSIS — I83811 Varicose veins of right lower extremities with pain: Secondary | ICD-10-CM

## 2021-03-10 NOTE — Progress Notes (Signed)
Subjective:    Patient ID: Kelsey Chavez, female    DOB: 1966-02-21, 55 y.o.   MRN: [id number] Chief Complaint  Patient presents with  . Follow-up    Ref Hande rle varicose veins    Kelsey Chavez is a 55 year old female that presents today after referral by her primary care physician due to pain in her right lower extremity.  Patient has had previous endovenous ablations of the bilateral great saphenous veins.  The patient did not undergo sclerotherapy following her ablations.  However she has noticed that her right leg has had more pain recently.  She notices it in the medial area of her leg.  She notes that the pain does not have a consistent pattern.  She does note that there have been new larger varicosities in that area.  She continues to wear her medical grade compression stockings.  She elevates as much as possible.  She notes that when the pain does occur such ability to perform daily activities.  She denies any fevers or chills.  She has also recently been diagnosed with stage I breast cancer.  Today noninvasive studies show no evidence of recannulation of the right great saphenous vein.  No DVT or superficial thrombophlebitis seen.  No evidence of deep venous insufficiency.   Review of Systems  Cardiovascular: Positive for leg swelling.  Hematological: Bruises/bleeds easily.  All other systems reviewed and are negative.      Objective:   Physical Exam Vitals reviewed.  HENT:     Head: Normocephalic.  Cardiovascular:     Rate and Rhythm: Normal rate.     Pulses: Normal pulses.  Pulmonary:     Effort: Pulmonary effort is normal.  Skin:    Comments: Several large superficial varicosities in the right leg  Neurological:     Mental Status: She is alert and oriented to person, place, and time.  Psychiatric:        Mood and Affect: Mood normal.        Behavior: Behavior normal.        Thought Content: Thought content normal.        Judgment: Judgment normal.      BP 96/61 (BP Location: Right Arm)   Pulse 60   Resp 16   Ht 5' (1.524 m)   Wt 167 lb (75.8 kg)   BMI 32.61 kg/m   Past Medical History:  Diagnosis Date  . Cancer ([place])    breast  . Depression   . Varicose veins of both lower extremities   . Vertigo     Social History   Socioeconomic History  . Marital status: Legally Separated    Spouse name: Not on file  . Number of children: Not on file  . Years of education: Not on file  . Highest education level: Not on file  Occupational History  . Not on file  Tobacco Use  . Smoking status: Never Smoker  . Smokeless tobacco: Never Used  Vaping Use  . Vaping Use: Never used  Substance and Sexual Activity  . Alcohol use: Yes    Alcohol/week: 4.0 standard drinks    Types: 2 Glasses of wine, 2 Cans of beer per week  . Drug use: No  . Sexual activity: Not on file  Other Topics Concern  . Not on file  Social History Narrative   Works at [employer]; lives in [place] with daughters; never smoked; no alcohol.   Social Determinants of Health   Financial Resource Strain:  Not on file  Food Insecurity: Not on file  Transportation Needs: Not on file  Physical Activity: Not on file  Stress: Not on file  Social Connections: Not on file  Intimate Partner Violence: Not on file    Past Surgical History:  Procedure Laterality Date  . BREAST BIOPSY Right 02/24/2021   u/s bx "  . CESAREAN SECTION  1996  . CHOLECYSTECTOMY    . COLONOSCOPY WITH PROPOFOL N/A 05/13/2019   Procedure: COLONOSCOPY WITH PROPOFOL;  Surgeon: Manya Silvas, MD;  [facility] ENDOSCOPY;  Service: Endoscopy;  Laterality: N/A;  . COLONOSCOPY WITH PROPOFOL N/A 09/25/2019   Procedure: COLONOSCOPY WITH PROPOFOL;  Surgeon: Toledo, Benay Pike, MD;  Location: [facility] ENDOSCOPY;  Service: Gastroenterology;  Laterality: N/A;  . DIAGNOSTIC LAPAROSCOPY    . TONSILLECTOMY      Family History  Problem Relation Age of Onset  . Varicose Veins Mother   . Breast  cancer Neg Hx     Allergies  Allergen Reactions  . Aspirin     Other reaction(s): Dizziness  . Shrimp Extract Allergy Skin Test Itching  . Shrimp [Shellfish Allergy] Itching    No flowsheet data found.    CMP  No results found for: NA, K, CL, CO2, GLUCOSE, BUN, CREATININE, CALCIUM, PROT, ALBUMIN, AST, ALT, ALKPHOS, BILITOT, GFRNONAA, GFRAA   No results found.     Assessment & Plan:   1. Varicose veins of leg with pain, right Recommend:  The patient has had successful ablation of the previously incompetent saphenous venous system but still has persistent symptoms of pain and swelling that are having a negative impact on daily life and daily activities.  Patient should undergo injection sclerotherapy to treat the residual varicosities.  The risks, benefits and alternative therapies were reviewed in detail with the patient.  All questions were answered.  The patient agrees to proceed with sclerotherapy at their convenience of the right lower extremity   The patient will continue wearing the graduated compression stockings and using the over-the-counter pain medications to treat her symptoms.      2. Pain of left lower extremity Discussed with patient that if pain persist following sclerotherapy reevaluation by PCP may be required.   Current Outpatient Medications on File Prior to Visit  Medication Sig Dispense Refill  . buPROPion (WELLBUTRIN XL) 150 MG 24 hr tablet Take 150 mg by mouth daily.    . Semaglutide (RYBELSUS) 14 MG TABS Take 14 mg by mouth daily.     No current facility-administered medications on file prior to visit.    There are no Patient Instructions on file for this visit. No follow-ups on file.   Kris Hartmann, NP

## 2021-03-17 ENCOUNTER — Other Ambulatory Visit: Payer: Self-pay

## 2021-03-17 ENCOUNTER — Encounter
Admission: RE | Admit: 2021-03-17 | Discharge: 2021-03-17 | Disposition: A | Discharge status: Home / Self Care | Payer: 59 | Source: Ambulatory Visit | Attending: General Surgery | Admitting: General Surgery

## 2021-03-17 DIAGNOSIS — Z01812 Encounter for preprocedural laboratory examination: Secondary | ICD-10-CM | POA: Insufficient documentation

## 2021-03-17 NOTE — Patient Instructions (Signed)
Your procedure is scheduled on:03-24-21 WEDNESDAY Report to the Registration Desk on the 1st floor of the [facility]-Then proceed to the Radiology Desk (2nd desk on right)-Arrive @ 10:00   REMEMBER: Instructions that are not followed completely may result in serious medical risk, up to and including death; or upon the discretion of your surgeon and anesthesiologist your surgery may need to be rescheduled.  Do not eat food after midnight the night before surgery.  No gum chewing, lozengers or hard candies.  You may however, drink CLEAR liquids up to 2 hours before you are scheduled to arrive for your surgery. Do not drink anything within 2 hours of your scheduled arrival time.  Clear liquids include: - water  - apple juice without pulp - gatorade - black coffee or tea (Do NOT add milk or creamers to the coffee or tea) Do NOT drink anything that is not on this list.  TAKE THESE MEDICATIONS THE MORNING OF SURGERY WITH A SIP OF WATER: -WELLBUTRIN (BUPROPION)  One week prior to surgery: Stop Anti-inflammatories (NSAIDS) such as Advil, Aleve, Ibuprofen, Motrin, Naproxen, Naprosyn and Aspirin based products such as Excedrin, Goodys Powder, BC Powder-OK TO TAKE TYLENOL IF NEEDED  Stop ANY OVER THE COUNTER supplements/vitamins until after surgery.  No Alcohol for 24 hours before or after surgery.  No Smoking including e-cigarettes for 24 hours prior to surgery.  No chewable tobacco products for at least 6 hours prior to surgery.  No nicotine patches on the day of surgery.  Do not use any "recreational" drugs for at least a week prior to your surgery.  Please be advised that the combination of cocaine and anesthesia may have negative outcomes, up to and including death. If you test positive for cocaine, your surgery will be cancelled.  On the morning of surgery brush your teeth with toothpaste and water, you may rinse your mouth with mouthwash if you wish. Do not swallow any toothpaste or  mouthwash.  Do not wear jewelry, make-up, hairpins, clips or nail polish.  Do not wear lotions, powders, or perfumes.   Do not shave body from the neck down 48 hours prior to surgery just in case you cut yourself which could leave a site for infection.  Also, freshly shaved skin may become irritated if using the CHG soap.  Contact lenses, hearing aids and dentures may not be worn into surgery.  Do not bring valuables to the hospital. [facility] is not responsible for any missing/lost belongings or valuables.   Use CHG Soap as directed on instruction sheet.  Notify your doctor if there is any change in your medical condition (cold, fever, infection).  Wear comfortable clothing (specific to your surgery type) to the hospital.  Plan for stool softeners for home use; pain medications have a tendency to cause constipation. You can also help prevent constipation by eating foods high in fiber such as fruits and vegetables and drinking plenty of fluids as your diet allows.  After surgery, you can help prevent lung complications by doing breathing exercises.  Take deep breaths and cough every 1-2 hours. Your doctor may order a device called an Incentive Spirometer to help you take deep breaths. When coughing or sneezing, hold a pillow firmly against your incision with both hands. This is called "splinting." Doing this helps protect your incision. It also decreases belly discomfort.  If you are being admitted to the hospital overnight, leave your suitcase in the car. After surgery it may be brought to your room.  If you are being discharged the day of surgery, you will not be allowed to drive home. You will need a responsible adult (18 years or older) to drive you home and stay with you that night.   If you are taking public transportation, you will need to have a responsible adult (18 years or older) with you. Please confirm with your physician that it is acceptable to use public  transportation.   Please call the [place] [facility] at [phone] if you have any questions about these instructions.  Surgery Visitation Policy:  Patients undergoing a surgery or procedure may have one family member or support person with them as long as that person is not COVID-19 positive or experiencing its symptoms.  That person may remain in the waiting area during the procedure.  Inpatient Visitation:    Visiting hours are 7 a.m. to 8 p.m. Inpatients will be allowed two visitors daily. The visitors may change each day during the patient's stay. No visitors under the age of 48. Any visitor under the age of 44 must be accompanied by an adult. The visitor must pass COVID-19 screenings, use hand sanitizer when entering and exiting the patient's room and wear a mask at all times, including in the patient's room. Patients must also wear a mask when staff or their visitor are in the room. Masking is required regardless of vaccination status.

## 2021-03-23 ENCOUNTER — Other Ambulatory Visit: Payer: Self-pay | Admitting: General Surgery

## 2021-03-23 DIAGNOSIS — C50411 Malignant neoplasm of upper-outer quadrant of right female breast: Secondary | ICD-10-CM

## 2021-03-23 DIAGNOSIS — Z170 Estrogen receptor positive status [ER+]: Secondary | ICD-10-CM

## 2021-03-23 MED ORDER — FAMOTIDINE 20 MG PO TABS: 20.0000 mg | ORAL_TABLET | Freq: Once | ORAL | Status: AC

## 2021-03-23 MED ORDER — CHLORHEXIDINE GLUCONATE 0.12 % MT SOLN: 15.0000 mL | Freq: Once | OROMUCOSAL | Status: AC

## 2021-03-23 MED ORDER — LACTATED RINGERS IV SOLN: INTRAVENOUS | Status: DC

## 2021-03-23 MED ORDER — CHLORHEXIDINE GLUCONATE CLOTH 2 % EX PADS: 6.0000 | MEDICATED_PAD | Freq: Once | CUTANEOUS | Status: DC

## 2021-03-23 MED ORDER — ORAL CARE MOUTH RINSE: 15.0000 mL | Freq: Once | OROMUCOSAL | Status: AC

## 2021-03-24 ENCOUNTER — Other Ambulatory Visit: Payer: Self-pay

## 2021-03-24 ENCOUNTER — Ambulatory Visit: Payer: 59 | Admitting: Certified Registered"

## 2021-03-24 ENCOUNTER — Ambulatory Visit
Admission: RE | Admit: 2021-03-24 | Discharge: 2021-03-24 | Disposition: A | Discharge status: Home / Self Care | Payer: 59 | Attending: General Surgery | Admitting: General Surgery

## 2021-03-24 ENCOUNTER — Ambulatory Visit
Admission: RE | Admit: 2021-03-24 | Discharge: 2021-03-24 | Disposition: A | Discharge status: Home / Self Care | Payer: 59 | Source: Ambulatory Visit | Attending: General Surgery | Admitting: General Surgery

## 2021-03-24 ENCOUNTER — Encounter: Payer: Self-pay | Admitting: General Surgery

## 2021-03-24 ENCOUNTER — Encounter
Admission: RE | Disposition: A | Discharge status: Home / Self Care | Payer: Self-pay | Source: Home / Self Care | Attending: General Surgery

## 2021-03-24 ENCOUNTER — Encounter
Admission: RE | Admit: 2021-03-24 | Discharge: 2021-03-24 | Disposition: A | Discharge status: Home / Self Care | Payer: 59 | Source: Ambulatory Visit | Attending: General Surgery | Admitting: General Surgery

## 2021-03-24 DIAGNOSIS — Z886 Allergy status to analgesic agent status: Secondary | ICD-10-CM | POA: Diagnosis not present

## 2021-03-24 DIAGNOSIS — I8393 Asymptomatic varicose veins of bilateral lower extremities: Secondary | ICD-10-CM | POA: Insufficient documentation

## 2021-03-24 DIAGNOSIS — Z170 Estrogen receptor positive status [ER+]: Secondary | ICD-10-CM

## 2021-03-24 DIAGNOSIS — Z79899 Other long term (current) drug therapy: Secondary | ICD-10-CM | POA: Diagnosis not present

## 2021-03-24 DIAGNOSIS — D360 Benign neoplasm of lymph nodes: Secondary | ICD-10-CM | POA: Diagnosis not present

## 2021-03-24 DIAGNOSIS — C50411 Malignant neoplasm of upper-outer quadrant of right female breast: Secondary | ICD-10-CM

## 2021-03-24 DIAGNOSIS — C50911 Malignant neoplasm of unspecified site of right female breast: Secondary | ICD-10-CM | POA: Insufficient documentation

## 2021-03-24 DIAGNOSIS — Z9049 Acquired absence of other specified parts of digestive tract: Secondary | ICD-10-CM | POA: Insufficient documentation

## 2021-03-24 HISTORY — PX: BREAST LUMPECTOMY WITH SENTINEL LYMPH NODE BIOPSY: SHX5597

## 2021-03-24 LAB — POCT PREGNANCY, URINE: Preg Test, Ur: NEGATIVE

## 2021-03-24 SURGERY — BREAST LUMPECTOMY WITH SENTINEL LYMPH NODE BX
Anesthesia: General | Laterality: Right

## 2021-03-24 MED ORDER — DEXAMETHASONE SODIUM PHOSPHATE 10 MG/ML IJ SOLN
INTRAMUSCULAR | Status: DC | PRN
  Administered 2021-03-24: 10 mg via INTRAVENOUS

## 2021-03-24 MED ORDER — FENTANYL CITRATE (PF) 100 MCG/2ML IJ SOLN
INTRAMUSCULAR | Status: AC
  Filled 2021-03-24: qty 2

## 2021-03-24 MED ORDER — METHYLENE BLUE 0.5 % INJ SOLN
INTRAVENOUS | Status: DC | PRN
  Administered 2021-03-24: 5 mL via SUBMUCOSAL

## 2021-03-24 MED ORDER — FAMOTIDINE 20 MG PO TABS
ORAL_TABLET | ORAL | Status: AC
  Administered 2021-03-24: 20 mg via ORAL
  Filled 2021-03-24: qty 1

## 2021-03-24 MED ORDER — BUPIVACAINE-EPINEPHRINE (PF) 0.5% -1:200000 IJ SOLN
INTRAMUSCULAR | Status: AC
  Filled 2021-03-24: qty 30

## 2021-03-24 MED ORDER — ONDANSETRON HCL 4 MG/2ML IJ SOLN
INTRAMUSCULAR | Status: DC | PRN
  Administered 2021-03-24: 4 mg via INTRAVENOUS

## 2021-03-24 MED ORDER — METHYLENE BLUE 0.5 % INJ SOLN
INTRAVENOUS | Status: AC
  Filled 2021-03-24: qty 10

## 2021-03-24 MED ORDER — ACETAMINOPHEN 10 MG/ML IV SOLN
INTRAVENOUS | Status: DC | PRN
  Administered 2021-03-24: 1000 mg via INTRAVENOUS

## 2021-03-24 MED ORDER — PROMETHAZINE HCL 25 MG/ML IJ SOLN
6.2500 mg | INTRAMUSCULAR | Status: DC | PRN
Start: 2021-03-24 — End: 2021-03-24

## 2021-03-24 MED ORDER — HYDROCODONE-ACETAMINOPHEN 5-325 MG PO TABS: 1.0000 | ORAL_TABLET | ORAL | 0 refills | Status: DC | PRN

## 2021-03-24 MED ORDER — BUPIVACAINE-EPINEPHRINE (PF) 0.5% -1:200000 IJ SOLN
INTRAMUSCULAR | Status: DC | PRN
  Administered 2021-03-24: 30 mL

## 2021-03-24 MED ORDER — FENTANYL CITRATE (PF) 100 MCG/2ML IJ SOLN
INTRAMUSCULAR | Status: DC | PRN
  Administered 2021-03-24 (×3): 25 ug via INTRAVENOUS

## 2021-03-24 MED ORDER — MIDAZOLAM HCL 2 MG/2ML IJ SOLN
INTRAMUSCULAR | Status: AC
  Filled 2021-03-24: qty 2

## 2021-03-24 MED ORDER — GLYCOPYRROLATE 0.2 MG/ML IJ SOLN
INTRAMUSCULAR | Status: DC | PRN
  Administered 2021-03-24: .2 mg via INTRAVENOUS

## 2021-03-24 MED ORDER — PROPOFOL 10 MG/ML IV BOLUS
INTRAVENOUS | Status: AC
  Filled 2021-03-24: qty 20

## 2021-03-24 MED ORDER — FENTANYL CITRATE (PF) 100 MCG/2ML IJ SOLN
25.0000 ug | INTRAMUSCULAR | Status: DC | PRN
  Administered 2021-03-24: 25 ug via INTRAVENOUS

## 2021-03-24 MED ORDER — PHENYLEPHRINE HCL (PRESSORS) 10 MG/ML IV SOLN
INTRAVENOUS | Status: DC | PRN
  Administered 2021-03-24 (×2): 100 ug via INTRAVENOUS

## 2021-03-24 MED ORDER — DEXMEDETOMIDINE (PRECEDEX) IN NS 20 MCG/5ML (4 MCG/ML) IV SYRINGE
PREFILLED_SYRINGE | INTRAVENOUS | Status: DC | PRN
  Administered 2021-03-24: 4 ug via INTRAVENOUS
  Administered 2021-03-24: 8 ug via INTRAVENOUS

## 2021-03-24 MED ORDER — LIDOCAINE HCL (CARDIAC) PF 100 MG/5ML IV SOSY
PREFILLED_SYRINGE | INTRAVENOUS | Status: DC | PRN
  Administered 2021-03-24: 100 mg via INTRAVENOUS

## 2021-03-24 MED ORDER — ACETAMINOPHEN 10 MG/ML IV SOLN
INTRAVENOUS | Status: AC
  Filled 2021-03-24: qty 100

## 2021-03-24 MED ORDER — PROPOFOL 10 MG/ML IV BOLUS
INTRAVENOUS | Status: DC | PRN
  Administered 2021-03-24: 150 mg via INTRAVENOUS

## 2021-03-24 MED ORDER — TECHNETIUM TC 99M TILMANOCEPT KIT
1.0000 | PACK | Freq: Once | INTRAVENOUS | Status: AC | PRN
  Administered 2021-03-24: 1.098 via INTRADERMAL

## 2021-03-24 MED ORDER — MIDAZOLAM HCL 2 MG/2ML IJ SOLN
INTRAMUSCULAR | Status: DC | PRN
  Administered 2021-03-24: 2 mg via INTRAVENOUS

## 2021-03-24 MED ORDER — CHLORHEXIDINE GLUCONATE 0.12 % MT SOLN
OROMUCOSAL | Status: AC
  Administered 2021-03-24: 15 mL via OROMUCOSAL
  Filled 2021-03-24: qty 15

## 2021-03-24 MED ORDER — EPHEDRINE SULFATE 50 MG/ML IJ SOLN
INTRAMUSCULAR | Status: DC | PRN
  Administered 2021-03-24: 5 mg via INTRAVENOUS

## 2021-03-24 SURGICAL SUPPLY — 55 items
APL PRP STRL LF DISP 70% ISPRP (MISCELLANEOUS) ×1
BINDER BREAST LRG (GAUZE/BANDAGES/DRESSINGS) ×2 IMPLANT
BLADE BOVIE TIP EXT 4 (BLADE) IMPLANT
BLADE SURG 15 STRL SS SAFETY (BLADE) ×4 IMPLANT
BULB RESERV EVAC DRAIN JP 100C (MISCELLANEOUS) IMPLANT
CHLORAPREP W/TINT 26 (MISCELLANEOUS) ×2 IMPLANT
CNTNR SPEC 2.5X3XGRAD LEK (MISCELLANEOUS)
CONT SPEC 4OZ STER OR WHT (MISCELLANEOUS)
CONT SPEC 4OZ STRL OR WHT (MISCELLANEOUS)
CONTAINER SPEC 2.5X3XGRAD LEK (MISCELLANEOUS) IMPLANT
COVER PROBE FLX POLY STRL (MISCELLANEOUS) ×2 IMPLANT
COVER WAND RF STERILE (DRAPES) ×2 IMPLANT
DEVICE DUBIN SPECIMEN MAMMOGRA (MISCELLANEOUS) ×2 IMPLANT
DRAIN CHANNEL JP 15F RND 16 (MISCELLANEOUS) IMPLANT
DRAPE LAPAROTOMY TRNSV 106X77 (MISCELLANEOUS) ×2 IMPLANT
DRSG GAUZE FLUFF 36X18 (GAUZE/BANDAGES/DRESSINGS) ×4 IMPLANT
DRSG TELFA 3X8 NADH (GAUZE/BANDAGES/DRESSINGS) ×2 IMPLANT
ELECT CAUTERY BLADE TIP 2.5 (TIP) ×2
ELECT REM PT RETURN 9FT ADLT (ELECTROSURGICAL) ×2
ELECTRODE CAUTERY BLDE TIP 2.5 (TIP) ×1 IMPLANT
ELECTRODE REM PT RTRN 9FT ADLT (ELECTROSURGICAL) ×1 IMPLANT
GLOVE SURG ENC MOIS LTX SZ7.5 (GLOVE) ×2 IMPLANT
GLOVE SURG UNDER LTX SZ8 (GLOVE) ×2 IMPLANT
GOWN STRL REUS W/ TWL LRG LVL3 (GOWN DISPOSABLE) ×2 IMPLANT
GOWN STRL REUS W/TWL LRG LVL3 (GOWN DISPOSABLE) ×4
KIT TURNOVER KIT A (KITS) ×2 IMPLANT
LABEL OR SOLS (LABEL) ×2 IMPLANT
MANIFOLD NEPTUNE II (INSTRUMENTS) ×2 IMPLANT
MARGIN MAP 10MM (MISCELLANEOUS) ×2 IMPLANT
NEEDLE HYPO 22GX1.5 SAFETY (NEEDLE) ×2 IMPLANT
NEEDLE HYPO 25X1 1.5 SAFETY (NEEDLE) ×4 IMPLANT
NEEDLE SPNL 20GX3.5 QUINCKE YW (NEEDLE) ×2 IMPLANT
PACK BASIN MINOR ARMC (MISCELLANEOUS) ×2 IMPLANT
RETRACTOR RING XSMALL (MISCELLANEOUS) IMPLANT
RTRCTR WOUND ALEXIS 13CM XS SH (MISCELLANEOUS)
SHEARS FOC LG CVD HARMONIC 17C (MISCELLANEOUS) IMPLANT
SHEARS HARMONIC 9CM CVD (BLADE) IMPLANT
SLEVE PROBE SENORX GAMMA FIND (MISCELLANEOUS) ×4 IMPLANT
STRIP CLOSURE SKIN 1/2X4 (GAUZE/BANDAGES/DRESSINGS) ×2 IMPLANT
SUT ETHILON 3-0 FS-10 30 BLK (SUTURE) ×2
SUT SILK 2 0 (SUTURE) ×2
SUT SILK 2-0 18XBRD TIE 12 (SUTURE) ×1 IMPLANT
SUT VIC AB 2-0 CT1 27 (SUTURE) ×4
SUT VIC AB 2-0 CT1 36 (SUTURE) ×2 IMPLANT
SUT VIC AB 2-0 CT1 TAPERPNT 27 (SUTURE) ×2 IMPLANT
SUT VIC AB 3-0 SH 27 (SUTURE) ×4
SUT VIC AB 3-0 SH 27X BRD (SUTURE) ×2 IMPLANT
SUT VIC AB 4-0 FS2 27 (SUTURE) ×4 IMPLANT
SUT VICRYL+ 3-0 144IN (SUTURE) ×2 IMPLANT
SUTURE EHLN 3-0 FS-10 30 BLK (SUTURE) ×1 IMPLANT
SWABSTK COMLB BENZOIN TINCTURE (MISCELLANEOUS) ×2 IMPLANT
SYR 10ML LL (SYRINGE) ×2 IMPLANT
SYR BULB IRRIG 60ML STRL (SYRINGE) ×2 IMPLANT
TAPE TRANSPORE STRL 2 31045 (GAUZE/BANDAGES/DRESSINGS) ×2 IMPLANT
WATER STERILE IRR 1000ML POUR (IV SOLUTION) ×2 IMPLANT

## 2021-03-24 NOTE — Anesthesia Preprocedure Evaluation (Signed)
Anesthesia Evaluation  Patient identified by MRN, date of birth, ID band Patient awake    Reviewed: Allergy & Precautions, NPO status , Patient's Chart, lab work & pertinent test results  History of Anesthesia Complications Negative for: history of anesthetic complications  Airway Mallampati: II  TM Distance: >3 FB Neck ROM: Full    Dental  (+) Partial Upper, Dental Advidsory Given   Pulmonary neg pulmonary ROS, neg shortness of breath, neg sleep apnea, neg COPD, neg recent URI,    breath sounds clear to auscultation- rhonchi (-) wheezing      Cardiovascular Exercise Tolerance: Good (-) hypertension(-) angina(-) CAD, (-) Past MI, (-) Cardiac Stents and (-) CABG  Rhythm:Regular Rate:Normal - Systolic murmurs and - Diastolic murmurs    Neuro/Psych neg Seizures PSYCHIATRIC DISORDERS Depression negative neurological ROS     GI/Hepatic negative GI ROS, Neg liver ROS,   Endo/Other  negative endocrine ROSneg diabetes  Renal/GU negative Renal ROS     Musculoskeletal negative musculoskeletal ROS (+)   Abdominal (+) + obese,   Peds  Hematology negative hematology ROS (+)   Anesthesia Other Findings Past Medical History: No date: Depression No date: Varicose veins of both lower extremities No date: Vertigo   Reproductive/Obstetrics                             Anesthesia Physical  Anesthesia Plan  ASA: II  Anesthesia Plan: General   Post-op Pain Management:    Induction: Intravenous  PONV Risk Score and Plan: 2 and Dexamethasone, Ondansetron, Midazolam and Treatment may vary due to age or medical condition  Airway Management Planned: LMA  Additional Equipment:   Intra-op Plan:   Post-operative Plan:   Informed Consent: I have reviewed the patients History and Physical, chart, labs and discussed the procedure including the risks, benefits and alternatives for the proposed anesthesia  with the patient or authorized representative who has indicated his/her understanding and acceptance.     Dental advisory given  Plan Discussed with: CRNA and Anesthesiologist  Anesthesia Plan Comments:         Anesthesia Quick Evaluation

## 2021-03-24 NOTE — Discharge Instructions (Signed)

## 2021-03-24 NOTE — Op Note (Signed)
Preoperative diagnosis: Invasive mammary carcinoma of the right breast, upper outer quadrant.  Postoperative diagnosis: Same.  Operative procedure: 1) pectoral block; 2) wide excision with ultrasound guidance and 3) sentinel node biopsy.  Operating surgeon: Hervey Ard, MD.  Anesthesia: General by LMA, Marcaine 0.5% with 1: 200,000 units of epinephrine: 30 cc.  Clinical note: This 55 year old woman was recent identified with breast cancer.  She desired breast conservation.  She was injected with technetium sulfur colloid earlier in the day.  She is brought to the operating room today for wide excision and sentinel node biopsy.  Operative note: With the patient under adequate general anesthesia the area of the nipple areolar complex in the right upper arm were cleansed with alcohol.  A total of 5 cc of 0.5% methylene blue was instilled to help with sentinel node biopsy in reverse node biopsy if needed.  The breast chest and axilla was then cleansed with ChloraPrep and draped.  Ultrasound was used to confirm location of the tumor mass.  Image obtained and placed in the chart for permanent record.  A small curvilinear incision in the upper outer quadrant was made over the mass carried down through skin subtendinous tissue with hemostasis achieved by electrocautery.  A 2 by 2 x 2 block of tissue was excised, specimen radiograph confirmed the previously placed clip and subsequent report from pathology showed all margins grossly negative.  Follow breast specimen was being prepared and reviewed attention was turned to the axilla.  The node seeker device was used and a small 2 and half centimeter incision made.  The skin was incised sharply and remaining dissection completed with electrocautery.  The axillary envelope was opened and the first node which was blue and counts of 7000 was removed.  2 additional lymph nodes with counts of 706 100 were then removed as well, these were unstained.  The axillary  envelope was closed with 2-0 Vicryl figure-of-eight suture.  The deep axillary tissue was approximated in similar fashion.  The skin was closed with a running 4-0 Vicryl subcuticular suture.  The breast defect was closed in layers with 2-0 Vicryl figure-of-eight sutures.  The skin was closed with a running 4-0 Vicryl subcuticular suture.  Benzoin, Steri-Strips, Telfa and Tegaderm dressings were applied.  Patient tolerated the procedure well and was taken recovery in stable condition.

## 2021-03-24 NOTE — Transfer of Care (Signed)
Immediate Anesthesia Transfer of Care Note  Patient: Kelsey Chavez  Procedure(s) Performed: BREAST LUMPECTOMY WITH SENTINEL LYMPH NODE BX (Right )  Patient Location: PACU  Anesthesia Type:General  Level of Consciousness: drowsy and patient cooperative  Airway & Oxygen Therapy: Patient Spontanous Breathing and Patient connected to face mask oxygen  Post-op Assessment: Report given to RN and Post -op Vital signs reviewed and stable  Post vital signs: Reviewed and stable  Last Vitals:  Vitals Value Taken Time  BP 130/66 03/24/21 1607  Temp    Pulse 77 03/24/21 1611  Resp 13 03/24/21 1611  SpO2 100 % 03/24/21 1611  Vitals shown include unvalidated device data.  Last Pain:  Vitals:   03/24/21 1104  TempSrc: Temporal  PainSc: 0-No pain         Complications: No complications documented.

## 2021-03-24 NOTE — H&P (Signed)
Kelsey Chavez [id number] [birth year]     HPI:  Healthy 55 y/o with recently diagnosed breast cancer.  She has elected to proceed with breast conservation.   Medications Prior to Admission  Medication Sig Dispense Refill Last Dose  . buPROPion (WELLBUTRIN XL) 150 MG 24 hr tablet Take 150 mg by mouth every morning.   03/24/2021 at 0900  . Semaglutide (RYBELSUS) 14 MG TABS Take 14 mg by mouth every morning. PT TAKES FOR WEIGHT LOSS ONLY   03/23/2021 at Unknown time   Allergies  Allergen Reactions  . Aspirin     Other reaction(s): Dizziness  . Shrimp Extract Allergy Skin Test Itching  . Shrimp [Shellfish Allergy] Itching   Past Medical History:  Diagnosis Date  . Cancer ([place])    breast  . Depression   . Varicose veins of both lower extremities   . Vertigo    Past Surgical History:  Procedure Laterality Date  . BREAST BIOPSY Right 02/24/2021   u/s bx "  . CESAREAN SECTION  1996  . CHOLECYSTECTOMY    . COLONOSCOPY WITH PROPOFOL N/A 05/13/2019   Procedure: COLONOSCOPY WITH PROPOFOL;  Surgeon: Manya Silvas, MD;  Location[facility] ENDOSCOPY;  Service: Endoscopy;  Laterality: N/A;  . COLONOSCOPY WITH PROPOFOL N/A 09/25/2019   Procedure: COLONOSCOPY WITH PROPOFOL;  Surgeon: Toledo, Benay Pike, MD;  Location: [facility] ENDOSCOPY;  Service: Gastroenterology;  Laterality: N/A;  . DIAGNOSTIC LAPAROSCOPY    . TONSILLECTOMY     Social History   Socioeconomic History  . Marital status: Legally Separated    Spouse name: Not on file  . Number of children: Not on file  . Years of education: Not on file  . Highest education level: Not on file  Occupational History  . Not on file  Tobacco Use  . Smoking status: Never Smoker  . Smokeless tobacco: Never Used  Vaping Use  . Vaping Use: Never used  Substance and Sexual Activity  . Alcohol use: Yes    Alcohol/week: 4.0 standard drinks    Types: 2 Glasses of wine, 2 Cans of beer per week    Comment: OCC  . Drug use: No  . Sexual activity:  Not on file  Other Topics Concern  . Not on file  Social History Narrative   Works at [employer]; lives in [place] with daughters; never smoked; no alcohol.   Social Determinants of Health   Financial Resource Strain: Not on file  Food Insecurity: Not on file  Transportation Needs: Not on file  Physical Activity: Not on file  Stress: Not on file  Social Connections: Not on file  Intimate Partner Violence: Not on file   Social History   Social History Narrative   Works at [employer]; lives in [place] with daughters; never smoked; no alcohol.     ROS: Negative.     PE: HEENT: Negative. Lungs: Clear. Cardio: RR.   Assessment/Plan:  Proceed with planned right breast wide excision.  Forest Gleason [facility] 03/24/2021

## 2021-03-24 NOTE — Anesthesia Procedure Notes (Signed)
Procedure Name: LMA Insertion Performed by: Kelton Pillar, CRNA Pre-anesthesia Checklist: Patient identified, Emergency Drugs available, Suction available and Patient being monitored Patient Re-evaluated:Patient Re-evaluated prior to induction Oxygen Delivery Method: Circle system utilized Preoxygenation: Pre-oxygenation with 100% oxygen Induction Type: IV induction Ventilation: Mask ventilation without difficulty LMA: LMA inserted Placement Confirmation: positive ETCO2,  CO2 detector and breath sounds checked- equal and bilateral Tube secured with: Tape Dental Injury: Teeth and Oropharynx as per pre-operative assessment

## 2021-03-25 ENCOUNTER — Encounter: Payer: Self-pay | Admitting: General Surgery

## 2021-03-25 NOTE — Anesthesia Postprocedure Evaluation (Signed)
Anesthesia Post Note  Patient: Kelsey Chavez  Procedure(s) Performed: BREAST LUMPECTOMY WITH SENTINEL LYMPH NODE BX (Right )  Patient location during evaluation: PACU Anesthesia Type: General Level of consciousness: awake and alert Pain management: pain level controlled Vital Signs Assessment: post-procedure vital signs reviewed and stable Respiratory status: spontaneous breathing, nonlabored ventilation, respiratory function stable and patient connected to nasal cannula oxygen Cardiovascular status: blood pressure returned to baseline and stable Postop Assessment: no apparent nausea or vomiting Anesthetic complications: no   No complications documented.   Last Vitals:  Vitals:   03/24/21 1703 03/24/21 1711  BP:  134/66  Pulse: 67 70  Resp:  18  Temp: (!) 36.1 C (!) 36.2 C  SpO2: 98% 99%    Last Pain:  Vitals:   03/25/21 0830  TempSrc:   PainSc: 4                  Martha Clan

## 2021-03-29 ENCOUNTER — Other Ambulatory Visit: Payer: Self-pay | Admitting: Anatomic Pathology & Clinical Pathology

## 2021-03-29 LAB — SURGICAL PATHOLOGY

## 2021-04-01 ENCOUNTER — Other Ambulatory Visit: Payer: Self-pay | Admitting: General Surgery

## 2021-04-01 DIAGNOSIS — Z170 Estrogen receptor positive status [ER+]: Secondary | ICD-10-CM

## 2021-04-01 DIAGNOSIS — C50411 Malignant neoplasm of upper-outer quadrant of right female breast: Secondary | ICD-10-CM

## 2021-04-05 ENCOUNTER — Other Ambulatory Visit: Payer: Self-pay | Admitting: General Surgery

## 2021-04-05 DIAGNOSIS — C50411 Malignant neoplasm of upper-outer quadrant of right female breast: Secondary | ICD-10-CM

## 2021-04-05 DIAGNOSIS — Z170 Estrogen receptor positive status [ER+]: Secondary | ICD-10-CM

## 2021-04-07 ENCOUNTER — Ambulatory Visit
Admission: RE | Admit: 2021-04-07 | Discharge: 2021-04-07 | Disposition: A | Discharge status: Home / Self Care | Payer: 59 | Source: Ambulatory Visit | Attending: Radiation Oncology | Admitting: Radiation Oncology

## 2021-04-07 ENCOUNTER — Other Ambulatory Visit: Payer: Self-pay

## 2021-04-07 ENCOUNTER — Encounter: Payer: Self-pay | Admitting: Radiation Oncology

## 2021-04-07 VITALS — BP 102/60 | HR 64 | Temp 97.6°F | Wt 166.0 lb

## 2021-04-07 DIAGNOSIS — Z170 Estrogen receptor positive status [ER+]: Secondary | ICD-10-CM | POA: Diagnosis not present

## 2021-04-07 DIAGNOSIS — Z923 Personal history of irradiation: Secondary | ICD-10-CM | POA: Insufficient documentation

## 2021-04-07 DIAGNOSIS — C50411 Malignant neoplasm of upper-outer quadrant of right female breast: Secondary | ICD-10-CM | POA: Insufficient documentation

## 2021-04-07 DIAGNOSIS — Z79899 Other long term (current) drug therapy: Secondary | ICD-10-CM | POA: Insufficient documentation

## 2021-04-07 NOTE — Consult Note (Signed)
NEW PATIENT EVALUATION  Name: Kelsey Chavez  MRN: [id number]  Date:   04/07/2021     DOB: 10/31/1966   This 55 y.o. female patient presents to the clinic for initial evaluation of stage Ia (T1b N0 M0) invasive mammary carcinoma of the right breast status post wide local excision and sentinel node biopsy ER/PR positive.  REFERRING PHYSICIAN: Tracie Harrier, MD  CHIEF COMPLAINT: No chief complaint on file.   DIAGNOSIS: The encounter diagnosis was Carcinoma of upper-outer quadrant of right breast in female, estrogen receptor positive ([place]).   PREVIOUS INVESTIGATIONS:  Mammogram and ultrasound reviewed Clinical notes reviewed Pathology report reviewed  HPI: Patient is a 55 year old female who presented with an abnormal mammogram of her right breast.  Both ultrasound and spot compressions showed a lesion at the 10 o'clock position 4 cm from the nipple measuring 9 x 5 x 7 mm.  She underwent targeted ultrasound which was positive for invasive mammary carcinoma.  She underwent wide local excision and sentinel node biopsy for a 7 mm grade 1 invasive mammary carcinoma with margins clear at 3 mm.  She had DCIS present also margins clear for that at 3 mm.  She had 4 lymph nodes examined 3 which were sentinel nodes all negative for metastatic disease.  Tumor was strongly ER/PR positive HER2/neu not overexpressed.  She is seen today for radiation oncology evaluation.  Oncotype DX has been ordered.  She is having some slight tenderness in the incision site although otherwise doing well.  She has had some slight nipple retraction which is concerned her and I have tried to alleviate those worries.  PLANNED TREATMENT REGIMEN: Hypofractionated right whole breast radiation  PAST MEDICAL HISTORY:  has a past medical history of Cancer ([place]), Depression, Varicose veins of both lower extremities, and Vertigo.    PAST SURGICAL HISTORY:  Past Surgical History:  Procedure Laterality Date  . BREAST BIOPSY  Right 02/24/2021   u/s bx "  . BREAST LUMPECTOMY WITH SENTINEL LYMPH NODE BIOPSY Right 03/24/2021   Procedure: BREAST LUMPECTOMY WITH SENTINEL LYMPH NODE BX;  Surgeon: Robert Bellow, MD;  Location: [facility] ORS;  Service: General;  Laterality: Right;  . CESAREAN SECTION  1996  . CHOLECYSTECTOMY    . COLONOSCOPY WITH PROPOFOL N/A 05/13/2019   Procedure: COLONOSCOPY WITH PROPOFOL;  Surgeon: Manya Silvas, MD;  Location: [facility] ENDOSCOPY;  Service: Endoscopy;  Laterality: N/A;  . COLONOSCOPY WITH PROPOFOL N/A 09/25/2019   Procedure: COLONOSCOPY WITH PROPOFOL;  Surgeon: Toledo, Benay Pike, MD;  Location: [facility] ENDOSCOPY;  Service: Gastroenterology;  Laterality: N/A;  . DIAGNOSTIC LAPAROSCOPY    . TONSILLECTOMY      FAMILY HISTORY: family history includes Varicose Veins in her mother.  SOCIAL HISTORY:  reports that she has never smoked. She has never used smokeless tobacco. She reports current alcohol use of about 4.0 standard drinks of alcohol per week. She reports that she does not use drugs.  ALLERGIES: Aspirin, Shrimp extract allergy skin test, and Shrimp [shellfish allergy]  MEDICATIONS:  Current Outpatient Medications  Medication Sig Dispense Refill  . buPROPion (WELLBUTRIN XL) 150 MG 24 hr tablet Take 150 mg by mouth every morning.    . Semaglutide (RYBELSUS) 14 MG TABS Take 14 mg by mouth every morning. PT TAKES FOR WEIGHT LOSS ONLY    . HYDROcodone-acetaminophen (NORCO/VICODIN) 5-325 MG tablet Take 1 tablet by mouth every 4 (four) hours as needed for moderate pain. (Patient not taking: Reported on 04/07/2021) 10 tablet 0   No  current facility-administered medications for this encounter.    ECOG PERFORMANCE STATUS:  0 - Asymptomatic  REVIEW OF SYSTEMS: Patient denies any weight loss, fatigue, weakness, fever, chills or night sweats. Patient denies any loss of vision, blurred vision. Patient denies any ringing  of the ears or hearing loss. No irregular heartbeat. Patient denies heart  murmur or history of fainting. Patient denies any chest pain or pain radiating to her upper extremities. Patient denies any shortness of breath, difficulty breathing at night, cough or hemoptysis. Patient denies any swelling in the lower legs. Patient denies any nausea vomiting, vomiting of blood, or coffee ground material in the vomitus. Patient denies any stomach pain. Patient states has had normal bowel movements no significant constipation or diarrhea. Patient denies any dysuria, hematuria or significant nocturia. Patient denies any problems walking, swelling in the joints or loss of balance. Patient denies any skin changes, loss of hair or loss of weight. Patient denies any excessive worrying or anxiety or significant depression. Patient denies any problems with insomnia. Patient denies excessive thirst, polyuria, polydipsia. Patient denies any swollen glands, patient denies easy bruising or easy bleeding. Patient denies any recent infections, allergies or URI. Patient "s visual fields have not changed significantly in recent time.   PHYSICAL EXAM: BP 102/60   Pulse 64   Temp 97.6 F (36.4 C) (Tympanic)   Wt 166 lb (75.3 kg)   BMI 32.42 kg/m  She status post wide local excision and sentinel node biopsy of the right breast both incisions are healing well no dominant masses noted in either breast.  No axillary or supraclavicular adenopathy is appreciated.  Well-developed well-nourished patient in NAD. HEENT reveals PERLA, EOMI, discs not visualized.  Oral cavity is clear. No oral mucosal lesions are identified. Neck is clear without evidence of cervical or supraclavicular adenopathy. Lungs are clear to A&P. Cardiac examination is essentially unremarkable with regular rate and rhythm without murmur rub or thrill. Abdomen is benign with no organomegaly or masses noted. Motor sensory and DTR levels are equal and symmetric in the upper and lower extremities. Cranial nerves II through XII are grossly intact.  Proprioception is intact. No peripheral adenopathy or edema is identified. No motor or sensory levels are noted. Crude visual fields are within normal range.  LABORATORY DATA: Pathology report reviewed    RADIOLOGY RESULTS: Mammogram and ultrasound reviewed compatible with above-stated findings.   IMPRESSION: Stage Ia low-grade invasive mammary carcinoma the right breast status post wide local excision and sentinel node biopsy ER/PR positive in 55 year old female  PLAN: At this time we will await the Oncotype DX results to determine chemotherapy although I doubt she will be a candidate for systemic treatment.  I would plan on delivering hypofractionated radiation therapy to her right breast over 3 weeks boosting her scar another 1000 cGy using electron beam.  Risks and benefits of treatment including skin reaction fatigue alteration of blood counts possible inclusion of superficial lung all were described in detail to the patient and her daughter.  They both seem to comprehend my treatment plan well.  I have tentatively set up her simulation appointment for June 7 although that may be delayed based on Oncotype Dx availability.  Patient also be a candidate for antiestrogen therapy after completion of radiation.  I would like to take this opportunity to thank you for allowing me to participate in the care of your patient.Noreene Filbert, MD

## 2021-04-20 ENCOUNTER — Ambulatory Visit: Payer: 59

## 2021-04-21 ENCOUNTER — Ambulatory Visit (INDEPENDENT_AMBULATORY_CARE_PROVIDER_SITE_OTHER): Payer: 59 | Admitting: Vascular Surgery

## 2021-04-21 ENCOUNTER — Encounter (INDEPENDENT_AMBULATORY_CARE_PROVIDER_SITE_OTHER): Payer: Self-pay | Admitting: Vascular Surgery

## 2021-04-21 ENCOUNTER — Other Ambulatory Visit: Payer: Self-pay

## 2021-04-21 VITALS — BP 119/75 | HR 69 | Resp 16 | Wt 166.4 lb

## 2021-04-21 DIAGNOSIS — I8393 Asymptomatic varicose veins of bilateral lower extremities: Secondary | ICD-10-CM | POA: Diagnosis not present

## 2021-04-21 DIAGNOSIS — M79605 Pain in left leg: Secondary | ICD-10-CM

## 2021-04-21 NOTE — Progress Notes (Signed)
Patient with recent diagnosis of breast cancer.  Patient is due to start radiation next week.  Patient would like to hold off on the sclerotherapy until she has completed her radiation treatment.  I think this is a reasonable request.  Recommended the patient reach out to our office to make an appointment for sclerotherapy when she is completed her radiation and she feels healthy enough to move forward.  Today's visit was with the assistance of a Spanish interpreter.

## 2021-04-22 ENCOUNTER — Telehealth: Payer: Self-pay | Admitting: Internal Medicine

## 2021-04-22 NOTE — Telephone Encounter (Signed)
Please have pt follow up with me in week of 20th- MD; No labs-Thanks GB

## 2021-04-23 NOTE — Telephone Encounter (Signed)
Appointment scheduled 05/04/21.

## 2021-04-26 ENCOUNTER — Telehealth: Payer: Self-pay | Admitting: *Deleted

## 2021-04-26 NOTE — Telephone Encounter (Signed)
Oncotype submitted for patient. Order number [id number]

## 2021-04-26 NOTE — Telephone Encounter (Signed)
Call received from [facility] at Dr Dwyane Luo office stating that Dr Bary Castilla did not order Oncotype testing and that [facility] is supposed to e the one that orders that test

## 2021-04-27 ENCOUNTER — Telehealth: Payer: Self-pay | Admitting: Internal Medicine

## 2021-04-27 ENCOUNTER — Ambulatory Visit
Admission: RE | Admit: 2021-04-27 | Discharge: 2021-04-27 | Disposition: A | Discharge status: Home / Self Care | Payer: 59 | Source: Ambulatory Visit | Attending: Radiation Oncology | Admitting: Radiation Oncology

## 2021-04-27 DIAGNOSIS — C50411 Malignant neoplasm of upper-outer quadrant of right female breast: Secondary | ICD-10-CM | POA: Insufficient documentation

## 2021-04-27 DIAGNOSIS — Z510 Encounter for antineoplastic radiation therapy: Secondary | ICD-10-CM | POA: Insufficient documentation

## 2021-04-27 NOTE — Telephone Encounter (Signed)
Contacted Oncotype to cnl the test.

## 2021-04-27 NOTE — Telephone Encounter (Signed)
Histologic Type: Invasive carcinoma of no special type (ductal)  Histologic Grade (Nottingham Histologic Score)                       Glandular ([place])/Tubular Differentiation: 2                       Nuclear Pleomorphism: 2                       Mitotic Rate: 1                       Overall Grade: Grade 1  Tumor Size: 7 mm  Ductal Carcinoma In Situ (DCIS): Present, low-grade  Lymphovascular Invasion: Not identified  ----------------------------------------------------------------------------------   Pathology-reviewed T1b; grade 1; no LVI-patient does not need Oncotype.  Proceed with radiation; follow-up with me as planned  GB

## 2021-04-30 ENCOUNTER — Other Ambulatory Visit: Payer: Self-pay | Admitting: *Deleted

## 2021-04-30 DIAGNOSIS — C50411 Malignant neoplasm of upper-outer quadrant of right female breast: Secondary | ICD-10-CM

## 2021-04-30 DIAGNOSIS — Z170 Estrogen receptor positive status [ER+]: Secondary | ICD-10-CM

## 2021-05-03 DIAGNOSIS — Z510 Encounter for antineoplastic radiation therapy: Secondary | ICD-10-CM | POA: Diagnosis not present

## 2021-05-04 ENCOUNTER — Inpatient Hospital Stay: Payer: 59 | Attending: Internal Medicine | Admitting: Internal Medicine

## 2021-05-04 DIAGNOSIS — Z170 Estrogen receptor positive status [ER+]: Secondary | ICD-10-CM | POA: Insufficient documentation

## 2021-05-04 DIAGNOSIS — C50411 Malignant neoplasm of upper-outer quadrant of right female breast: Secondary | ICD-10-CM | POA: Diagnosis not present

## 2021-05-04 NOTE — Assessment & Plan Note (Addendum)
#  Breast cancer stage I ER/PR positive HER2 negative status postlumpectomy [ pathology-reviewed T1b; grade 1; no LVI]-given the small size of the tumor/low-grade lack of high risk features not recommend Oncotype.  #Proceed with adjuvant radiation; started on radiation.  Discussed that she would not need any chemotherapy.  We will plan to start adjuvant endocrine therapy post radiation. #Discussed the role of anti-hormonal therapy mechanism of action; since patient is premenopausal recommend tamoxifen.  I would recommend tamoxifen for 5 years.  Long discussion regarding the potential adverse events on tamoxifen including but not limited to hot flashes, mood swings, thromboembolic events strokes and also small risk of uterine cancers.  However at any rate the benefits of the endocrine treatment outweighs the risk.  # GENETICS: Discussed regarding genetic predisposition versus sporadic nature of breast cancer. One boy and 2 daughters; otherwise no family history.   # DISPOSITION: # genetics referral re: breast cancer # follow up 1st week of August 2022- Dr.B

## 2021-05-04 NOTE — Progress Notes (Signed)
one [place] NOTE  Patient Care Team: Tracie Harrier, MD as PCP - General (Internal Medicine) Rico Junker, RN as Oncology Nurse Navigator  CHIEF COMPLAINTS/PURPOSE OF CONSULTATION: Breast cancer  #  Oncology History Overview Note  # RIGHT BREAST CA- cT1b [43m]c N0; ER/PR- POSITIVE  [>90%]; her 2 NEG; GRADE-1 [Dr.Byrnett]; status postlumpectomy-grade 1; tumor size 7 mm; no LVI; clear margins-no Oncotype recommended                        # SURVIVORSHIP:   # GENETICS:   DIAGNOSIS:   STAGE:         ;  GOALS:  CURRENT/MOST RECENT THERAPY :       Carcinoma of upper-outer quadrant of right breast in female, estrogen receptor positive ([place]  03/04/2021 Initial Diagnosis   Carcinoma of upper-outer quadrant of right breast in female, estrogen receptor positive ([place]   03/04/2021 Cancer Staging   Staging form: Breast, AJCC 8th Edition - Clinical: Stage IA (cT1b, cN0, cM0, G1, ER+, PR+, HER2-) - Signed by BCammie Sickle MD on 03/04/2021  Histologic grading system: 3 grade system       HISTORY OF PRESENTING ILLNESS:  Kelsey Fredrickson89y.o.  female patient with recent history of stage I ER/PR positive HER2 negative breast cancer s/p lumpectomy is here for follow-up.    In the interim patient was evaluated by radiation oncology plan to start radiation this week.  Denies any new lumps or bumps.  Appetite is good with no weight loss.  No nausea vomiting.  Patient is perimenopausal  Review of Systems  Constitutional:  Negative for chills, diaphoresis, fever, malaise/fatigue and weight loss.  HENT:  Negative for nosebleeds and sore throat.   Eyes:  Negative for double vision.  Respiratory:  Negative for cough, hemoptysis, sputum production, shortness of breath and wheezing.   Cardiovascular:  Negative for chest pain, palpitations, orthopnea and leg swelling.  Gastrointestinal:  Negative for abdominal pain, blood in stool, constipation,  diarrhea, heartburn, melena, nausea and vomiting.  Genitourinary:  Negative for dysuria, frequency and urgency.  Musculoskeletal:  Negative for back pain and joint pain.  Skin: Negative.  Negative for itching and rash.  Neurological:  Negative for dizziness, tingling, focal weakness, weakness and headaches.  Endo/Heme/Allergies:  Does not bruise/bleed easily.  Psychiatric/Behavioral:  Negative for depression. The patient is not nervous/anxious and does not have insomnia.     MEDICAL HISTORY:  Past Medical History:  Diagnosis Date   Cancer ([facility]    breast   Depression    Varicose veins of both lower extremities    Vertigo     SURGICAL HISTORY: Past Surgical History:  Procedure Laterality Date   BREAST BIOPSY Right 02/24/2021   u/s bx "   BREAST LUMPECTOMY WITH SENTINEL LYMPH NODE BIOPSY Right 03/24/2021   Procedure: BREAST LUMPECTOMY WITH SENTINEL LYMPH NODE BX;  Surgeon: BRobert Bellow MD;  Location: [facility] ORS;  Service: General;  [place]: Right;   [place]    COLONOSCOPY WITH PROPOFOL N/A 05/13/2019   Procedure: COLONOSCOPY WITH PROPOFOL;  Surgeon: EManya Silvas MD;  [facility];  Service: Endoscopy;  Laterality: N/A;   COLONOSCOPY WITH PROPOFOL N/A 09/25/2019   Procedure: COLONOSCOPY WITH PROPOFOL;  Surgeon: Toledo, TBenay Pike MD;  Location: [facility] ENDOSCOPY;  Service: Gastroenterology;  Laterality: N/A;   DIAGNOSTIC LAPAROSCOPY     TONSILLECTOMY  SOCIAL HISTORY: Social History   Socioeconomic History   Marital status: Legally Separated    Spouse name: Not on file   Number of children: Not on file   Years of education: Not on file   Highest education level: Not on file  Occupational History   Not on file  Tobacco Use   Smoking status: Never   Smokeless tobacco: Never  Vaping Use   Vaping Use: Never used  Substance and Sexual Activity   Alcohol use: Yes    Alcohol/week: 4.0 standard drinks    Types: 2  Glasses of wine, 2 Cans of beer per week    Comment: OCC   Drug use: No   Sexual activity: Not on file  Other Topics Concern   Not on file  Social History Narrative   Works at [employer]; lives in [place] with daughters; never smoked; no alcohol.   Social Determinants of Health   Financial Resource Strain: Not on file  Food Insecurity: Not on file  Transportation Needs: Not on file  Physical Activity: Not on file  Stress: Not on file  Social Connections: Not on file  Intimate Partner Violence: Not on file    FAMILY HISTORY: Family History  Problem Relation Age of Onset   Varicose Veins Mother    Breast cancer Neg Hx     ALLERGIES:  is allergic to aspirin, shrimp extract allergy skin test, and shrimp [shellfish allergy].  MEDICATIONS:  Current Outpatient Medications  Medication Sig Dispense Refill   buPROPion (WELLBUTRIN XL) 150 MG 24 hr tablet Take 150 mg by mouth every morning.     Semaglutide (RYBELSUS) 14 MG TABS Take 14 mg by mouth every morning. PT TAKES FOR WEIGHT LOSS ONLY     HYDROcodone-acetaminophen (NORCO/VICODIN) 5-325 MG tablet Take 1 tablet by mouth every 4 (four) hours as needed for moderate pain. (Patient not taking: No sig reported) 10 tablet 0   No current facility-administered medications for this visit.      Marland Kitchen  PHYSICAL EXAMINATION: ECOG PERFORMANCE STATUS: 0 - Asymptomatic  Vitals:   05/04/21 1530  BP: 107/64  Pulse: 66  Resp: 20  Temp: 97.8 F (36.6 C)  SpO2: 100%   Filed Weights   05/04/21 1530  Weight: 164 lb 12.8 oz (74.8 kg)    Physical Exam HENT:     Head: Normocephalic and atraumatic.     Mouth/Throat:     Pharynx: No oropharyngeal exudate.  Eyes:     Pupils: Pupils are equal, round, and reactive to light.  Cardiovascular:     Rate and Rhythm: Normal rate and regular rhythm.  Pulmonary:     Effort: Pulmonary effort is normal. No respiratory distress.     Breath sounds: Normal breath sounds. No wheezing.  Abdominal:      General: Bowel sounds are normal. There is no distension.     Palpations: Abdomen is soft. There is no mass.     Tenderness: no abdominal tenderness There is no guarding or rebound.  Musculoskeletal:        General: No tenderness. Normal range of motion.     Cervical back: Normal range of motion and neck supple.  Skin:    General: Skin is warm.  Neurological:     Mental Status: She is alert and oriented to person, place, and time.  Psychiatric:        Mood and Affect: Affect normal.     LABORATORY DATA:  I have reviewed the data as listed No  results found for: WBC, HGB, HCT, MCV, PLT No results for input(s): NA, K, CL, CO2, GLUCOSE, BUN, CREATININE, CALCIUM, GFRNONAA, GFRAA, PROT, ALBUMIN, AST, ALT, ALKPHOS, BILITOT, BILIDIR, IBILI in the last 8760 hours.  RADIOGRAPHIC STUDIES: I have personally reviewed the radiological images as listed and agreed with the findings in the report. No results found.   ASSESSMENT & PLAN:   Carcinoma of upper-outer quadrant of right breast in female, estrogen receptor positive ([place]) #Breast cancer stage I ER/PR positive HER2 negative status postlumpectomy [ pathology-reviewed T1b; grade 1; no LVI]-given the small size of the tumor/low-grade lack of high risk features not recommend Oncotype.  #Proceed with adjuvant radiation; started on radiation.  Discussed that she would not need any chemotherapy.  We will plan to start adjuvant endocrine therapy post radiation. #Discussed the role of anti-hormonal therapy mechanism of action; since patient is premenopausal recommend tamoxifen.  I would recommend tamoxifen for 5 years.  Long discussion regarding the potential adverse events on tamoxifen including but not limited to hot flashes, mood swings, thromboembolic events strokes and also small risk of uterine cancers.  However at any rate the benefits of the endocrine treatment outweighs the risk.  # GENETICS: Discussed regarding genetic predisposition versus  sporadic nature of breast cancer. One boy and 2 daughters; otherwise no family history.   # DISPOSITION: # genetics referral re: breast cancer # follow up 1st week of August 2022- Dr.B  All questions were answered. The patient/family knows to call the clinic with any problems, questions or concerns.    Cammie Sickle, MD 05/04/2021 10:25 PM

## 2021-05-05 ENCOUNTER — Ambulatory Visit: Admission: RE | Admit: 2021-05-05 | Payer: 59 | Source: Ambulatory Visit

## 2021-05-05 DIAGNOSIS — Z510 Encounter for antineoplastic radiation therapy: Secondary | ICD-10-CM | POA: Diagnosis not present

## 2021-05-06 ENCOUNTER — Ambulatory Visit
Admission: RE | Admit: 2021-05-06 | Discharge: 2021-05-06 | Disposition: A | Discharge status: Home / Self Care | Payer: 59 | Source: Ambulatory Visit | Attending: Radiation Oncology | Admitting: Radiation Oncology

## 2021-05-06 DIAGNOSIS — Z510 Encounter for antineoplastic radiation therapy: Secondary | ICD-10-CM | POA: Diagnosis not present

## 2021-05-07 ENCOUNTER — Ambulatory Visit
Admission: RE | Admit: 2021-05-07 | Discharge: 2021-05-07 | Disposition: A | Discharge status: Home / Self Care | Payer: 59 | Source: Ambulatory Visit | Attending: Radiation Oncology | Admitting: Radiation Oncology

## 2021-05-07 DIAGNOSIS — Z510 Encounter for antineoplastic radiation therapy: Secondary | ICD-10-CM | POA: Diagnosis not present

## 2021-05-10 ENCOUNTER — Ambulatory Visit
Admission: RE | Admit: 2021-05-10 | Discharge: 2021-05-10 | Disposition: A | Discharge status: Home / Self Care | Payer: 59 | Source: Ambulatory Visit | Attending: Radiation Oncology | Admitting: Radiation Oncology

## 2021-05-10 DIAGNOSIS — Z510 Encounter for antineoplastic radiation therapy: Secondary | ICD-10-CM | POA: Diagnosis not present

## 2021-05-11 ENCOUNTER — Ambulatory Visit
Admission: RE | Admit: 2021-05-11 | Discharge: 2021-05-11 | Disposition: A | Discharge status: Home / Self Care | Payer: 59 | Source: Ambulatory Visit | Attending: Radiation Oncology | Admitting: Radiation Oncology

## 2021-05-11 DIAGNOSIS — Z510 Encounter for antineoplastic radiation therapy: Secondary | ICD-10-CM | POA: Diagnosis not present

## 2021-05-12 ENCOUNTER — Ambulatory Visit
Admission: RE | Admit: 2021-05-12 | Discharge: 2021-05-12 | Disposition: A | Discharge status: Home / Self Care | Payer: 59 | Source: Ambulatory Visit | Attending: Radiation Oncology | Admitting: Radiation Oncology

## 2021-05-12 DIAGNOSIS — Z510 Encounter for antineoplastic radiation therapy: Secondary | ICD-10-CM | POA: Diagnosis not present

## 2021-05-13 ENCOUNTER — Ambulatory Visit
Admission: RE | Admit: 2021-05-13 | Discharge: 2021-05-13 | Disposition: A | Discharge status: Home / Self Care | Payer: 59 | Source: Ambulatory Visit | Attending: Radiation Oncology | Admitting: Radiation Oncology

## 2021-05-13 ENCOUNTER — Inpatient Hospital Stay: Payer: 59 | Admitting: Licensed Clinical Social Worker

## 2021-05-13 ENCOUNTER — Inpatient Hospital Stay: Payer: 59

## 2021-05-13 DIAGNOSIS — Z510 Encounter for antineoplastic radiation therapy: Secondary | ICD-10-CM | POA: Diagnosis not present

## 2021-05-14 ENCOUNTER — Ambulatory Visit
Admission: RE | Admit: 2021-05-14 | Discharge: 2021-05-14 | Disposition: A | Discharge status: Home / Self Care | Payer: 59 | Source: Ambulatory Visit | Attending: Radiation Oncology | Admitting: Radiation Oncology

## 2021-05-14 DIAGNOSIS — Z510 Encounter for antineoplastic radiation therapy: Secondary | ICD-10-CM | POA: Insufficient documentation

## 2021-05-14 DIAGNOSIS — C50411 Malignant neoplasm of upper-outer quadrant of right female breast: Secondary | ICD-10-CM | POA: Insufficient documentation

## 2021-05-18 ENCOUNTER — Ambulatory Visit
Admission: RE | Admit: 2021-05-18 | Discharge: 2021-05-18 | Disposition: A | Discharge status: Home / Self Care | Payer: 59 | Source: Ambulatory Visit | Attending: Radiation Oncology | Admitting: Radiation Oncology

## 2021-05-18 DIAGNOSIS — Z510 Encounter for antineoplastic radiation therapy: Secondary | ICD-10-CM | POA: Diagnosis not present

## 2021-05-19 ENCOUNTER — Ambulatory Visit
Admission: RE | Admit: 2021-05-19 | Discharge: 2021-05-19 | Disposition: A | Discharge status: Home / Self Care | Payer: 59 | Source: Ambulatory Visit | Attending: Radiation Oncology | Admitting: Radiation Oncology

## 2021-05-19 DIAGNOSIS — Z510 Encounter for antineoplastic radiation therapy: Secondary | ICD-10-CM | POA: Diagnosis not present

## 2021-05-20 ENCOUNTER — Ambulatory Visit
Admission: RE | Admit: 2021-05-20 | Discharge: 2021-05-20 | Disposition: A | Discharge status: Home / Self Care | Payer: 59 | Source: Ambulatory Visit | Attending: Radiation Oncology | Admitting: Radiation Oncology

## 2021-05-20 DIAGNOSIS — Z510 Encounter for antineoplastic radiation therapy: Secondary | ICD-10-CM | POA: Diagnosis not present

## 2021-05-21 ENCOUNTER — Other Ambulatory Visit: Payer: Self-pay

## 2021-05-21 ENCOUNTER — Inpatient Hospital Stay: Payer: 59 | Attending: Radiation Oncology

## 2021-05-21 ENCOUNTER — Ambulatory Visit
Admission: RE | Admit: 2021-05-21 | Discharge: 2021-05-21 | Disposition: A | Discharge status: Home / Self Care | Payer: 59 | Source: Ambulatory Visit | Attending: Radiation Oncology | Admitting: Radiation Oncology

## 2021-05-21 DIAGNOSIS — C50411 Malignant neoplasm of upper-outer quadrant of right female breast: Secondary | ICD-10-CM | POA: Insufficient documentation

## 2021-05-21 DIAGNOSIS — Z510 Encounter for antineoplastic radiation therapy: Secondary | ICD-10-CM | POA: Diagnosis not present

## 2021-05-21 LAB — CBC
HCT: 37.4 % (ref 36.0–46.0)
Hemoglobin: 12.3 g/dL (ref 12.0–15.0)
MCH: 30.6 pg (ref 26.0–34.0)
MCHC: 32.9 g/dL (ref 30.0–36.0)
MCV: 93 fL (ref 80.0–100.0)
Platelets: 226 10*3/uL (ref 150–400)
RBC: 4.02 MIL/uL (ref 3.87–5.11)
RDW: 12.8 % (ref 11.5–15.5)
WBC: 4.3 10*3/uL (ref 4.0–10.5)
nRBC: 0 % (ref 0.0–0.2)

## 2021-05-24 ENCOUNTER — Ambulatory Visit
Admission: RE | Admit: 2021-05-24 | Discharge: 2021-05-24 | Disposition: A | Discharge status: Home / Self Care | Payer: 59 | Source: Ambulatory Visit | Attending: Radiation Oncology | Admitting: Radiation Oncology

## 2021-05-24 DIAGNOSIS — Z510 Encounter for antineoplastic radiation therapy: Secondary | ICD-10-CM | POA: Diagnosis not present

## 2021-05-25 ENCOUNTER — Ambulatory Visit
Admission: RE | Admit: 2021-05-25 | Discharge: 2021-05-25 | Disposition: A | Discharge status: Home / Self Care | Payer: 59 | Source: Ambulatory Visit | Attending: Radiation Oncology | Admitting: Radiation Oncology

## 2021-05-25 DIAGNOSIS — Z510 Encounter for antineoplastic radiation therapy: Secondary | ICD-10-CM | POA: Diagnosis not present

## 2021-05-26 ENCOUNTER — Ambulatory Visit
Admission: RE | Admit: 2021-05-26 | Discharge: 2021-05-26 | Disposition: A | Discharge status: Home / Self Care | Payer: 59 | Source: Ambulatory Visit | Attending: Radiation Oncology | Admitting: Radiation Oncology

## 2021-05-26 DIAGNOSIS — Z510 Encounter for antineoplastic radiation therapy: Secondary | ICD-10-CM | POA: Diagnosis not present

## 2021-05-27 ENCOUNTER — Ambulatory Visit
Admission: RE | Admit: 2021-05-27 | Discharge: 2021-05-27 | Disposition: A | Discharge status: Home / Self Care | Payer: 59 | Source: Ambulatory Visit | Attending: Radiation Oncology | Admitting: Radiation Oncology

## 2021-05-27 DIAGNOSIS — Z510 Encounter for antineoplastic radiation therapy: Secondary | ICD-10-CM | POA: Diagnosis not present

## 2021-05-28 ENCOUNTER — Ambulatory Visit
Admission: RE | Admit: 2021-05-28 | Discharge: 2021-05-28 | Disposition: A | Discharge status: Home / Self Care | Payer: 59 | Source: Ambulatory Visit | Attending: Radiation Oncology | Admitting: Radiation Oncology

## 2021-05-28 DIAGNOSIS — Z510 Encounter for antineoplastic radiation therapy: Secondary | ICD-10-CM | POA: Diagnosis not present

## 2021-05-31 ENCOUNTER — Ambulatory Visit
Admission: RE | Admit: 2021-05-31 | Discharge: 2021-05-31 | Disposition: A | Discharge status: Home / Self Care | Payer: 59 | Source: Ambulatory Visit | Attending: Radiation Oncology | Admitting: Radiation Oncology

## 2021-05-31 DIAGNOSIS — Z510 Encounter for antineoplastic radiation therapy: Secondary | ICD-10-CM | POA: Diagnosis not present

## 2021-06-01 ENCOUNTER — Ambulatory Visit
Admission: RE | Admit: 2021-06-01 | Discharge: 2021-06-01 | Disposition: A | Discharge status: Home / Self Care | Payer: 59 | Source: Ambulatory Visit | Attending: Radiation Oncology | Admitting: Radiation Oncology

## 2021-06-01 DIAGNOSIS — Z510 Encounter for antineoplastic radiation therapy: Secondary | ICD-10-CM | POA: Diagnosis not present

## 2021-06-02 ENCOUNTER — Ambulatory Visit
Admission: RE | Admit: 2021-06-02 | Discharge: 2021-06-02 | Disposition: A | Discharge status: Home / Self Care | Payer: 59 | Source: Ambulatory Visit | Attending: Radiation Oncology | Admitting: Radiation Oncology

## 2021-06-02 DIAGNOSIS — Z510 Encounter for antineoplastic radiation therapy: Secondary | ICD-10-CM | POA: Diagnosis not present

## 2021-06-03 ENCOUNTER — Ambulatory Visit
Admission: RE | Admit: 2021-06-03 | Discharge: 2021-06-03 | Disposition: A | Discharge status: Home / Self Care | Payer: 59 | Source: Ambulatory Visit | Attending: Radiation Oncology | Admitting: Radiation Oncology

## 2021-06-03 DIAGNOSIS — Z510 Encounter for antineoplastic radiation therapy: Secondary | ICD-10-CM | POA: Diagnosis not present

## 2021-06-04 ENCOUNTER — Ambulatory Visit
Admission: RE | Admit: 2021-06-04 | Discharge: 2021-06-04 | Disposition: A | Discharge status: Home / Self Care | Payer: 59 | Source: Ambulatory Visit | Attending: Radiation Oncology | Admitting: Radiation Oncology

## 2021-06-04 DIAGNOSIS — Z510 Encounter for antineoplastic radiation therapy: Secondary | ICD-10-CM | POA: Diagnosis not present

## 2021-06-07 ENCOUNTER — Telehealth: Payer: Self-pay | Admitting: Internal Medicine

## 2021-06-07 NOTE — Telephone Encounter (Signed)
Patient is requesting to come at 845 on 8/1 due to her work schedule and having to take off a whole day for the 10am appointment.  Routing to team for follow up.

## 2021-06-08 NOTE — Telephone Encounter (Signed)
Ok to chg apt time- to patient's preference

## 2021-06-14 ENCOUNTER — Inpatient Hospital Stay: Payer: 59 | Admitting: Internal Medicine

## 2021-06-24 ENCOUNTER — Ambulatory Visit: Payer: 59 | Admitting: Internal Medicine

## 2021-06-29 ENCOUNTER — Inpatient Hospital Stay: Payer: 59 | Attending: Internal Medicine | Admitting: Internal Medicine

## 2021-06-29 ENCOUNTER — Other Ambulatory Visit: Payer: Self-pay

## 2021-06-29 ENCOUNTER — Encounter: Payer: Self-pay | Admitting: Internal Medicine

## 2021-06-29 DIAGNOSIS — L598 Other specified disorders of the skin and subcutaneous tissue related to radiation: Secondary | ICD-10-CM | POA: Insufficient documentation

## 2021-06-29 DIAGNOSIS — C50411 Malignant neoplasm of upper-outer quadrant of right female breast: Secondary | ICD-10-CM | POA: Insufficient documentation

## 2021-06-29 DIAGNOSIS — Z170 Estrogen receptor positive status [ER+]: Secondary | ICD-10-CM | POA: Insufficient documentation

## 2021-06-29 MED ORDER — TAMOXIFEN CITRATE 20 MG PO TABS: 20.0000 mg | ORAL_TABLET | Freq: Every day | ORAL | 0 refills | Status: DC

## 2021-06-29 NOTE — Progress Notes (Signed)
one [place] NOTE  Patient Care Team: Tracie Harrier, MD as PCP - General (Internal Medicine) Rico Junker, RN as Oncology Nurse Navigator  CHIEF COMPLAINTS/PURPOSE OF CONSULTATION: Breast cancer  #  Oncology History Overview Note  # RIGHT BREAST CA- cT1b [25mm]c N0; ER/PR- POSITIVE  [>90%]; her 2 NEG; GRADE-1 [Dr.Byrnett]; status postlumpectomy-grade 1; tumor size 7 mm; no LVI; clear margins-no Oncotype recommended.s/p RT.  # SEP 1st week, 2022-start tamoxifen [perimenopausal]; wean off Wellbutrin                        # SURVIVORSHIP:   # GENETICS:   DIAGNOSIS:   STAGE:         ;  GOALS:  CURRENT/MOST RECENT THERAPY :       Carcinoma of upper-outer quadrant of right breast in female, estrogen receptor positive ([place])  03/04/2021 Initial Diagnosis   Carcinoma of upper-outer quadrant of right breast in female, estrogen receptor positive ([place])   03/04/2021 Cancer Staging   Staging form: Breast, AJCC 8th Edition - Clinical: Stage IA (cT1b, cN0, cM0, G1, ER+, PR+, HER2-) - Signed by Cammie Sickle, MD on 03/04/2021 Histologic grading system: 3 grade system      HISTORY OF PRESENTING ILLNESS:  Kelsey Chavez 55 y.o.  female patient with recent history of stage I ER/PR positive HER2 negative breast cancer s/p lumpectomy followed by radiation is here for follow-up.  Patient finished radiation approximately 3 weeks ago.  Noted to have radiation dermatitis currently improving.  Patient is awaiting to go on a cruise this week.  Review of Systems  Constitutional:  Negative for chills, diaphoresis, fever, malaise/fatigue and weight loss.  HENT:  Negative for nosebleeds and sore throat.   Eyes:  Negative for double vision.  Respiratory:  Negative for cough, hemoptysis, sputum production, shortness of breath and wheezing.   Cardiovascular:  Negative for chest pain, palpitations, orthopnea and leg swelling.  Gastrointestinal:  Negative for  abdominal pain, blood in stool, constipation, diarrhea, heartburn, melena, nausea and vomiting.  Genitourinary:  Negative for dysuria, frequency and urgency.  Musculoskeletal:  Negative for back pain and joint pain.  Skin: Negative.  Negative for itching and rash.  Neurological:  Negative for dizziness, tingling, focal weakness, weakness and headaches.  Endo/Heme/Allergies:  Does not bruise/bleed easily.  Psychiatric/Behavioral:  Negative for depression. The patient is not nervous/anxious and does not have insomnia.     MEDICAL HISTORY:  Past Medical History:  Diagnosis Date   Cancer [facility])    breast   Depression    Varicose veins of both lower extremities    Vertigo     SURGICAL HISTORY: Past Surgical History:  Procedure Laterality Date   BREAST BIOPSY Right 02/24/2021   u/s bx "   BREAST LUMPECTOMY WITH SENTINEL LYMPH NODE BIOPSY Right 03/24/2021   Procedure: BREAST LUMPECTOMY WITH SENTINEL LYMPH NODE BX;  Surgeon: Robert Bellow, MD;  Location: [facility] ORS;  Service: General;  Laterality: Right;   [place]     COLONOSCOPY WITH PROPOFOL N/A 05/13/2019   Procedure: COLONOSCOPY WITH PROPOFOL;  Surgeon: Manya Silvas, MD;  [facility];  Service: Endoscopy;  Laterality: N/A;   COLONOSCOPY WITH PROPOFOL N/A 09/25/2019   Procedure: COLONOSCOPY WITH PROPOFOL;  Surgeon: Toledo, Benay Pike, MD;  Location: [facility] ENDOSCOPY;  Service: Gastroenterology;  Laterality: N/A;   DIAGNOSTIC LAPAROSCOPY     TONSILLECTOMY      SOCIAL  HISTORY: Social History   Socioeconomic History   Marital status: Legally Separated    Spouse name: Not on file   Number of children: Not on file   Years of education: Not on file   Highest education level: Not on file  Occupational History   Not on file  Tobacco Use   Smoking status: Never   Smokeless tobacco: Never  Vaping Use   Vaping Use: Never used  Substance and Sexual Activity   Alcohol use: Yes     Alcohol/week: 4.0 standard drinks    Types: 2 Glasses of wine, 2 Cans of beer per week    Comment: OCC   Drug use: No   Sexual activity: Not on file  Other Topics Concern   Not on file  Social History Narrative   Works at [employer]; lives in [place] with daughters; never smoked; no alcohol.   Social Determinants of Health   Financial Resource Strain: Not on file  Food Insecurity: Not on file  Transportation Needs: Not on file  Physical Activity: Not on file  Stress: Not on file  Social Connections: Not on file  Intimate Partner Violence: Not on file    FAMILY HISTORY: Family History  Problem Relation Age of Onset   Varicose Veins Mother    Breast cancer Neg Hx     ALLERGIES:  is allergic to aspirin, shrimp extract allergy skin test, and shrimp [shellfish allergy].  MEDICATIONS:  Current Outpatient Medications  Medication Sig Dispense Refill   buPROPion (WELLBUTRIN XL) 150 MG 24 hr tablet Take 150 mg by mouth every morning.     Semaglutide (RYBELSUS) 14 MG TABS Take 14 mg by mouth every morning. PT TAKES FOR WEIGHT LOSS ONLY     tamoxifen (NOLVADEX) 20 MG tablet Take 1 tablet (20 mg total) by mouth daily. 60 tablet 0   HYDROcodone-acetaminophen (NORCO/VICODIN) 5-325 MG tablet Take 1 tablet by mouth every 4 (four) hours as needed for moderate pain. (Patient not taking: No sig reported) 10 tablet 0   No current facility-administered medications for this visit.      Marland Kitchen  PHYSICAL EXAMINATION: ECOG PERFORMANCE STATUS: 0 - Asymptomatic  Vitals:   06/29/21 1022  BP: 104/66  Pulse: 76  Resp: 16  Temp: 97.6 F (36.4 C)  SpO2: 98%   Filed Weights   06/29/21 1022  Weight: 165 lb 9.1 oz (75.1 kg)    Physical Exam HENT:     Head: Normocephalic and atraumatic.     Mouth/Throat:     Pharynx: No oropharyngeal exudate.  Eyes:     Pupils: Pupils are equal, round, and reactive to light.  Cardiovascular:     Rate and Rhythm: Normal rate and regular rhythm.  Pulmonary:      Effort: Pulmonary effort is normal. No respiratory distress.     Breath sounds: Normal breath sounds. No wheezing.  Abdominal:     General: Bowel sounds are normal. There is no distension.     Palpations: Abdomen is soft. There is no mass.     Tenderness: no abdominal tenderness There is no guarding or rebound.  Musculoskeletal:        General: No tenderness. Normal range of motion.     Cervical back: Normal range of motion and neck supple.  Skin:    General: Skin is warm.  Neurological:     Mental Status: She is alert and oriented to person, place, and time.  Psychiatric:        Mood and  Affect: Affect normal.     LABORATORY DATA:  I have reviewed the data as listed Lab Results  Component Value Date   WBC 4.3 05/21/2021   HGB 12.3 05/21/2021   HCT 37.4 05/21/2021   MCV 93.0 05/21/2021   PLT 226 05/21/2021   No results for input(s): NA, K, CL, CO2, GLUCOSE, BUN, CREATININE, CALCIUM, GFRNONAA, GFRAA, PROT, ALBUMIN, AST, ALT, ALKPHOS, BILITOT, BILIDIR, IBILI in the last 8760 hours.  RADIOGRAPHIC STUDIES: I have personally reviewed the radiological images as listed and agreed with the findings in the report. No results found.   ASSESSMENT & PLAN:   Carcinoma of upper-outer quadrant of right breast in female, estrogen receptor positive ([place]) #Breast cancer stage I ER/PR positive HER2 negative] postlumpectomy T1b; grade 1; no LVI]-given the small size of the tumor/low-grade lack of high risk features not recommend Oncotype.  Given no residual chest would not recommend chemotherapy.  Discussed with patient.  #Since patient is s/p radiation [end of July 2022]-proceed with endocrine therapy.  #Discussed the role of anti-hormonal therapy mechanism of action; since patient is premenopausal recommend tamoxifen. I would recommend tamoxifen for 5 years.  Long discussion regarding the potential adverse events on tamoxifen including but not limited to hot flashes, mood swings,  thromboembolic events strokes and also small risk of uterine cancers.  However at any rate the benefits of the endocrine treatment outweighs the risk.   #Depression-on Wellbutrin.   Offered to switch her over to newer SSRI-Celexa. [Given Wellbutrin drug interaction with tamoxifen].  Patient declines Celexa at this time- ; however patient not sure if Wellbutrin is really helping or not.  Recommend wean off Wellbutrin 1 pill every other day for the next 2 weeks [after coming from the cruise].  Then start tamoxifen.  New prescription started.  Written instructions given.  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  # GENETICS: Discussed regarding genetic predisposition versus sporadic nature of breast cancer. One boy and 2 daughters; otherwise no family history.   # DISPOSITION: # follow up in 2 months [[place]] MD; no labs-Dr.B  All questions were answered. The patient/family knows to call the clinic with any problems, questions or concerns.    Cammie Sickle, MD 06/29/2021 12:46 PM

## 2021-06-29 NOTE — Assessment & Plan Note (Addendum)
#  Breast cancer stage I ER/PR positive HER2 negative] postlumpectomy T1b; grade 1; no LVI]-given the small size of the tumor/low-grade lack of high risk features not recommend Oncotype.  Given no residual chest would not recommend chemotherapy.  Discussed with patient.  #Since patient is s/p radiation [end of July 2022]-proceed with endocrine therapy.  #Discussed the role of anti-hormonal therapy mechanism of action; since patient is premenopausal recommend tamoxifen. I would recommend tamoxifen for 5 years.  Long discussion regarding the potential adverse events on tamoxifen including but not limited to hot flashes, mood swings, thromboembolic events strokes and also small risk of uterine cancers.  However at any rate the benefits of the endocrine treatment outweighs the risk.   #Depression-on Wellbutrin.   Offered to switch her over to newer SSRI-Celexa. [Given Wellbutrin drug interaction with tamoxifen].  Patient declines Celexa at this time- ; however patient not sure if Wellbutrin is really helping or not.  Recommend wean off Wellbutrin 1 pill every other day for the next 2 weeks [after coming from the cruise].  Then start tamoxifen.  New prescription started.  Written instructions given.  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  # GENETICS: Discussed regarding genetic predisposition versus sporadic nature of breast cancer. One boy and 2 daughters; otherwise no family history.   # DISPOSITION: # follow up in 2 months [[place]] MD; no labs-Dr.B

## 2021-06-29 NOTE — Patient Instructions (Addendum)
#  After coming from cruise-take Wellbutrin 1 pill every other day for total of 2 weeks and then stop  # After stopping the Wellbutrin-Start tamoxifen as recommended  #Call us if any issues coming off Wellbutrin/or after starting tamoxifen

## 2021-07-14 ENCOUNTER — Ambulatory Visit
Admission: RE | Admit: 2021-07-14 | Discharge: 2021-07-14 | Disposition: A | Discharge status: Home / Self Care | Payer: 59 | Source: Ambulatory Visit | Attending: Radiation Oncology | Admitting: Radiation Oncology

## 2021-07-14 ENCOUNTER — Encounter: Payer: Self-pay | Admitting: Radiation Oncology

## 2021-07-14 ENCOUNTER — Other Ambulatory Visit: Payer: Self-pay

## 2021-07-14 VITALS — BP 94/64 | HR 63 | Temp 97.6°F | Resp 16 | Wt 170.0 lb

## 2021-07-14 DIAGNOSIS — C50411 Malignant neoplasm of upper-outer quadrant of right female breast: Secondary | ICD-10-CM | POA: Diagnosis not present

## 2021-07-14 DIAGNOSIS — Z170 Estrogen receptor positive status [ER+]: Secondary | ICD-10-CM | POA: Diagnosis not present

## 2021-07-14 DIAGNOSIS — Z923 Personal history of irradiation: Secondary | ICD-10-CM | POA: Insufficient documentation

## 2021-07-14 NOTE — Progress Notes (Signed)
Radiation Oncology Follow up Note  Name: Kelsey Chavez   Date:   07/14/2021 MRN:  [id number] DOB: June 13, 1966    This 55 y.o. female presents to the clinic today for 1 month follow-up status post whole breast radiation to her right breast for stage Ia ER/PR positive invasive mammary carcinoma.  REFERRING PROVIDER: Tracie Harrier, MD  HPI: Patient is a 55 year old female now at 1 month having completed whole breast radiation to her right breast status post wide local excision and sentinel node biopsy for ER/PR positive invasive mammary carcinoma stage Ia.  Seen today in routine follow-up she is doing well.  She specifically denies breast tenderness cough or bone pain.  She has not started antiestrogen therapy yet is waiting to speak to medical oncology about that..  COMPLICATIONS OF TREATMENT: none  FOLLOW UP COMPLIANCE: keeps appointments   PHYSICAL EXAM:  BP 94/64   Pulse 63   Temp 97.6 F (36.4 C) (Tympanic)   Resp 16   Wt 170 lb (77.1 kg)   BMI 33.20 kg/m  Lungs are clear to A&P cardiac examination essentially unremarkable with regular rate and rhythm. No dominant mass or nodularity is noted in either breast in 2 positions examined. Incision is well-healed. No axillary or supraclavicular adenopathy is appreciated. Cosmetic result is excellent.  Well-developed well-nourished patient in NAD. HEENT reveals PERLA, EOMI, discs not visualized.  Oral cavity is clear. No oral mucosal lesions are identified. Neck is clear without evidence of cervical or supraclavicular adenopathy. Lungs are clear to A&P. Cardiac examination is essentially unremarkable with regular rate and rhythm without murmur rub or thrill. Abdomen is benign with no organomegaly or masses noted. Motor sensory and DTR levels are equal and symmetric in the upper and lower extremities. Cranial nerves II through XII are grossly intact. Proprioception is intact. No peripheral adenopathy or edema is identified. No motor or  sensory levels are noted. Crude visual fields are within normal range.  RADIOLOGY RESULTS: No current films to review  PLAN: Present time patient is doing well 1 month out from whole breast radiation and pleased with her overall progress.  Very low side effect profile.  She will see medical oncology about discussion of antiestrogen therapy.  I have asked to see her back in 4 to 5 months for follow-up.  Patient knows to call with any concerns.  I would like to take this opportunity to thank you for allowing me to participate in the care of your patient.Noreene Filbert, MD

## 2021-09-01 ENCOUNTER — Inpatient Hospital Stay: Payer: 59 | Attending: Internal Medicine | Admitting: Internal Medicine

## 2021-09-01 ENCOUNTER — Other Ambulatory Visit: Payer: Self-pay

## 2021-09-01 VITALS — BP 92/54 | HR 63 | Temp 97.8°F | Resp 18 | Wt 169.0 lb

## 2021-09-01 DIAGNOSIS — Z170 Estrogen receptor positive status [ER+]: Secondary | ICD-10-CM

## 2021-09-01 DIAGNOSIS — Z79810 Long term (current) use of selective estrogen receptor modulators (SERMs): Secondary | ICD-10-CM | POA: Insufficient documentation

## 2021-09-01 DIAGNOSIS — C50411 Malignant neoplasm of upper-outer quadrant of right female breast: Secondary | ICD-10-CM | POA: Diagnosis present

## 2021-09-01 MED ORDER — CITALOPRAM HYDROBROMIDE 20 MG PO TABS: 20.0000 mg | ORAL_TABLET | Freq: Every day | ORAL | 3 refills | Status: DC

## 2021-09-01 MED ORDER — TAMOXIFEN CITRATE 20 MG PO TABS: 20.0000 mg | ORAL_TABLET | Freq: Every day | ORAL | 1 refills | Status: DC

## 2021-09-01 NOTE — Progress Notes (Signed)
Survivorship Care Plan visit completed.  Treatment summary reviewed and given to patient.  ASCO answers booklet reviewed and given to patient.  CARE program and Cancer Transitions discussed with patient along with other resources cancer center offers to patients and caregivers.  Patient verbalized understanding.    

## 2021-09-01 NOTE — Assessment & Plan Note (Addendum)
#  Breast cancer stage I ER/PR positive HER2 negative] postlumpectomy T1b; grade 1; no LVI] [low risk; NO oncotype; no chemo]. currently on Tamoxifen [started sep 1st, 2022]  # Itchy/rash- Upper extremities [last 1 month]- Improved; unlikely from Tamoxifen.   #Depression-recommendCelexa 20 mg/day-which does not interfere with tamoxifen.  Also discussed that with mild help with treat hot flashes.   # GENETICS: Discussed regarding genetic predisposition versus sporadic nature of breast cancer. One boy and 2 daughters; otherwise no family history. interetsed will make referral.   # DISPOSITION: # Genetics referral- re: Breast cancer # follow up in 3 months- MD--Dr.B

## 2021-09-01 NOTE — Progress Notes (Signed)
[facility] [place] NOTE  Patient Care Team: Tracie Harrier, MD as PCP - General (Internal Medicine) Rico Junker, RN as Oncology Nurse Navigator Cammie Sickle, MD as Consulting Physician (Internal Medicine) Noreene Filbert, MD as Consulting Physician (Radiation Oncology) Bary Castilla Forest Gleason, MD as Consulting Physician (General Surgery)  CHIEF COMPLAINTS/PURPOSE OF CONSULTATION: Breast cancer  #  Oncology History Overview Note  # RIGHT BREAST CA- cT1b [57m]c N0; ER/PR- POSITIVE  [>90%]; her 2 NEG; GRADE-1 [Dr.Byrnett]; status postlumpectomy-grade 1; tumor size 7 mm; no LVI; clear margins-no Oncotype recommended.s/p RT.  # SEP 1st week, 2022-start tamoxifen [perimenopausal]; wean off Wellbutrin; OCT 2022- celexa                        # SURVIVORSHIP:   # GENETICS:   DIAGNOSIS:   STAGE:         ;  GOALS:  CURRENT/MOST RECENT THERAPY :       Carcinoma of upper-outer quadrant of right breast in female, estrogen receptor positive ([place]  03/04/2021 Initial Diagnosis   Carcinoma of upper-outer quadrant of right breast in [place], [place] positive [place]   03/04/2021 Cancer Staging   Staging form: Breast, AJCC 8th Edition - Clinical: Stage IA (cT1b, cN0, cM0, G1, ER+, PR+, HER2-) - Signed by BCammie Sickle MD on 03/04/2021 Histologic grading system: 3 grade system      HISTORY OF PRESENTING ILLNESS: Alone.  Ambulating independently.  Kelsey Fredrickson89y.o.  female patient with recent history of stage I ER/PR positive HER2 negative breast cancer-currently tamoxifen is here for follow-up.  Patient noted to have a rash about 3 weeks ago itchy upper extremities resolved.  S/p evaluation with PCP  Patient returned from a trip/cruise.  Uneventful.  Patient denies any significant hot flashes.  Does admit to mild weight gain.    Review of Systems  Constitutional:  Negative for chills, diaphoresis, fever, malaise/fatigue and weight  loss.  HENT:  Negative for nosebleeds and sore throat.   Eyes:  Negative for double vision.  Respiratory:  Negative for cough, hemoptysis, sputum production, shortness of breath and wheezing.   Cardiovascular:  Negative for chest pain, palpitations, orthopnea and leg swelling.  Gastrointestinal:  Negative for abdominal pain, blood in stool, constipation, diarrhea, heartburn, melena, nausea and vomiting.  Genitourinary:  Negative for dysuria, frequency and urgency.  Musculoskeletal:  Negative for back pain and joint pain.  Skin: Negative.  Negative for itching and rash.  Neurological:  Negative for dizziness, tingling, focal weakness, weakness and headaches.  Endo/Heme/Allergies:  Does not bruise/bleed easily.  Psychiatric/Behavioral:  Negative for depression. The patient is not nervous/anxious and does not have insomnia.     MEDICAL HISTORY:  Past Medical History:  Diagnosis Date   Cancer ([facility]    breast   Depression    Varicose veins of both lower extremities    Vertigo     SURGICAL HISTORY: Past Surgical History:  Procedure Laterality Date   BREAST BIOPSY Right 02/24/2021   u/s bx "   BREAST LUMPECTOMY WITH SENTINEL LYMPH NODE BIOPSY Right 03/24/2021   Procedure: BREAST LUMPECTOMY WITH SENTINEL LYMPH NODE BX;  Surgeon: BRobert Bellow MD;  Location: [facility] ORS;  Service: General;  Laterality: Right;   [place]    COLONOSCOPY WITH PROPOFOL N/A 05/13/2019   Procedure: COLONOSCOPY WITH PROPOFOL;  Surgeon: EManya Silvas MD;  [facility]  Service: Endoscopy;  Laterality: N/A;   COLONOSCOPY WITH PROPOFOL N/A 09/25/2019   Procedure: COLONOSCOPY WITH PROPOFOL;  Surgeon: Toledo, Benay Pike, MD;  Location: [facility] ENDOSCOPY;  Service: Gastroenterology;  Laterality: N/A;   DIAGNOSTIC LAPAROSCOPY     TONSILLECTOMY      SOCIAL HISTORY: Social History   Socioeconomic History   Marital status: Legally Separated    Spouse name: Not on  file   Number of children: Not on file   Years of education: Not on file   Highest education level: Not on file  Occupational History   Not on file  Tobacco Use   Smoking status: Never   Smokeless tobacco: Never  Vaping Use   Vaping Use: Never used  Substance and Sexual Activity   Alcohol use: Yes    Alcohol/week: 4.0 standard drinks    Types: 2 Glasses of wine, 2 Cans of beer per week    Comment: OCC   Drug use: No   Sexual activity: Not on file  Other Topics Concern   Not on file  Social History Narrative   Works at [employer]; lives in [place] with daughters; never smoked; no alcohol.   Social Determinants of Health   Financial Resource Strain: Not on file  Food Insecurity: Not on file  Transportation Needs: Not on file  Physical Activity: Not on file  Stress: Not on file  Social Connections: Not on file  Intimate Partner Violence: Not on file    FAMILY HISTORY: Family History  Problem Relation Age of Onset   Varicose Veins Mother    Breast cancer Neg Hx     ALLERGIES:  is allergic to aspirin, shrimp extract allergy skin test, and shrimp [shellfish allergy].  MEDICATIONS:  Current Outpatient Medications  Medication Sig Dispense Refill   citalopram (CELEXA) 20 MG tablet Take 1 tablet (20 mg total) by mouth daily. 30 tablet 3   Semaglutide (RYBELSUS) 14 MG TABS Take 14 mg by mouth every morning. PT TAKES FOR WEIGHT LOSS ONLY     hydrocortisone 2.5 % cream Apply topically. (Patient not taking: Reported on 09/01/2021)     tamoxifen (NOLVADEX) 20 MG tablet Take 1 tablet (20 mg total) by mouth daily. 90 tablet 1   No current facility-administered medications for this visit.      Marland Kitchen  PHYSICAL EXAMINATION: ECOG PERFORMANCE STATUS: 0 - Asymptomatic  Vitals:   09/01/21 1028  BP: (!) 92/54  Pulse: 63  Resp: 18  Temp: 97.8 F (36.6 C)  SpO2: 100%   Filed Weights   09/01/21 1028  Weight: 169 lb (76.7 kg)    Physical Exam HENT:     Head: Normocephalic  and atraumatic.     Mouth/Throat:     Pharynx: No oropharyngeal exudate.  Eyes:     Pupils: Pupils are equal, round, and reactive to light.  Cardiovascular:     Rate and Rhythm: Normal rate and regular rhythm.  Pulmonary:     Effort: Pulmonary effort is normal. No respiratory distress.     Breath sounds: Normal breath sounds. No wheezing.  Abdominal:     General: Bowel sounds are normal. There is no distension.     Palpations: Abdomen is soft. There is no mass.     Tenderness: There is no abdominal tenderness. There is no guarding or rebound.  Musculoskeletal:        General: No tenderness. Normal range of motion.     Cervical back: Normal range of motion and neck supple.  Skin:  General: Skin is warm.  Neurological:     Mental Status: She is alert and oriented to person, place, and time.  Psychiatric:        Mood and Affect: Affect normal.     LABORATORY DATA:  I have reviewed the data as listed Lab Results  Component Value Date   WBC 4.3 05/21/2021   HGB 12.3 05/21/2021   HCT 37.4 05/21/2021   MCV 93.0 05/21/2021   PLT 226 05/21/2021   No results for input(s): NA, K, CL, CO2, GLUCOSE, BUN, CREATININE, CALCIUM, GFRNONAA, GFRAA, PROT, ALBUMIN, AST, ALT, ALKPHOS, BILITOT, BILIDIR, IBILI in the last 8760 hours.  RADIOGRAPHIC STUDIES: I have personally reviewed the radiological images as listed and agreed with the findings in the report. No results found.   ASSESSMENT & PLAN:   Carcinoma of upper-outer quadrant of right breast in female, estrogen receptor positive ([place]) #Breast cancer stage I ER/PR positive HER2 negative] postlumpectomy T1b; grade 1; no LVI] [low risk; NO oncotype; no chemo]. currently on Tamoxifen [started sep 1st, 2022]  # Itchy/rash- Upper extremities [last 1 month]- Improved; unlikely from Tamoxifen.   #Depression-recommendCelexa 20 mg/day-which does not interfere with tamoxifen.  Also discussed that with mild help with treat hot flashes.   #  GENETICS: Discussed regarding genetic predisposition versus sporadic nature of breast cancer. One boy and 2 daughters; otherwise no family history. interetsed will make referral.   # DISPOSITION: # Genetics referral- re: Breast cancer # follow up in 3 months- MD--Dr.B  All questions were answered. The patient/family knows to call the clinic with any problems, questions or concerns.    Cammie Sickle, MD 09/01/2021 2:51 PM

## 2021-09-10 ENCOUNTER — Telehealth: Payer: Self-pay | Admitting: Internal Medicine

## 2021-09-10 NOTE — Telephone Encounter (Signed)
FYI for you with pt

## 2021-09-10 NOTE — Telephone Encounter (Signed)
Pt called to cancel appt. With genic cons. Please call [phone] to reschedule

## 2021-09-13 ENCOUNTER — Telehealth: Payer: Self-pay | Admitting: Internal Medicine

## 2021-09-13 NOTE — Telephone Encounter (Signed)
Pt has appt on 11-3. I think that she wants to cancel because she was asking about Thursday and Friday for appt. Please call back at [phone]

## 2021-09-13 NOTE — Telephone Encounter (Signed)
Already took care of this appt before lunch

## 2021-09-13 NOTE — Telephone Encounter (Signed)
Kelsey Chavez- pls call patient. Her apts with genetics is scheduled for Wed.

## 2021-09-15 ENCOUNTER — Inpatient Hospital Stay: Payer: 59

## 2021-09-15 ENCOUNTER — Encounter: Payer: Self-pay | Admitting: Licensed Clinical Social Worker

## 2021-09-15 ENCOUNTER — Inpatient Hospital Stay: Payer: 59 | Attending: Internal Medicine | Admitting: Licensed Clinical Social Worker

## 2021-09-15 ENCOUNTER — Other Ambulatory Visit: Payer: Self-pay

## 2021-09-15 DIAGNOSIS — C50411 Malignant neoplasm of upper-outer quadrant of right female breast: Secondary | ICD-10-CM

## 2021-09-15 DIAGNOSIS — Z170 Estrogen receptor positive status [ER+]: Secondary | ICD-10-CM | POA: Diagnosis not present

## 2021-09-15 NOTE — Progress Notes (Signed)
REFERRING PROVIDER: Cammie Sickle, MD [place],  [place] [address]  PRIMARY PROVIDER:  Tracie Harrier, MD  PRIMARY REASON FOR VISIT:  1. Carcinoma of upper-outer quadrant of right breast in female, estrogen receptor positive ([place])      HISTORY OF PRESENT ILLNESS:   Kelsey Chavez, a 55 y.o. female, was seen for a [place] cancer genetics consultation at the request of Dr. Rogue Bussing due to a personal history of breast cancer.  Kelsey Chavez presents to clinic today to discuss the possibility of a hereditary predisposition to cancer, genetic testing, and to further clarify her future cancer risks, as well as potential cancer risks for family members.   In 2022, at the age of 64, Kelsey Chavez was diagnosed with right invasive mammary carcinoma with DCIS, ER/PR+, Her2-. This was treated with lumpectomy, radiation and she is currently taking tamoxifen.   CANCER HISTORY:  Oncology History Overview Note  # RIGHT BREAST CA- cT1b [52mm]c N0; ER/PR- POSITIVE  [>90%]; her 2 NEG; GRADE-1 [Dr.Byrnett]; status postlumpectomy-grade 1; tumor size 7 mm; no LVI; clear margins-no Oncotype recommended.s/p RT.  # SEP 1st week, 2022-start tamoxifen [perimenopausal]; wean off Wellbutrin; OCT 2022- celexa                        # SURVIVORSHIP:   # GENETICS:   DIAGNOSIS:   STAGE:         ;  GOALS:  CURRENT/MOST RECENT THERAPY :       Carcinoma of upper-outer quadrant of right breast in female, estrogen receptor positive ([place])  03/04/2021 Initial Diagnosis   Carcinoma of upper-outer quadrant of right breast in female, estrogen receptor positive ([place])   03/04/2021 Cancer Staging   Staging form: Breast, AJCC 8th Edition - Clinical: Stage IA (cT1b, cN0, cM0, G1, ER+, PR+, HER2-) - Signed by Cammie Sickle, MD on 03/04/2021 Histologic grading system: 3 grade system       RISK FACTORS:  Menarche was at age 23.  First live birth at age 85.  OCP use for less than a  year.  Ovaries intact: yes.  Hysterectomy: no.  HRT use: 0 years. Colonoscopy: yes;  in 2020, 1 polyp . Mammogram within the last year: yes. Up to date with pelvic exams: yes. Any excessive radiation exposure in the past: no  Past Medical History:  Diagnosis Date   Cancer ([place])    breast   Depression    Varicose veins of both lower extremities    Vertigo     Past Surgical History:  Procedure Laterality Date   BREAST BIOPSY Right 02/24/2021   u/s bx "   BREAST LUMPECTOMY WITH SENTINEL LYMPH NODE BIOPSY Right 03/24/2021   Procedure: BREAST LUMPECTOMY WITH SENTINEL LYMPH NODE BX;  Surgeon: Robert Bellow, MD;  Location: [facility] ORS;  Service: General;  Laterality: Right;   [place]     COLONOSCOPY WITH PROPOFOL N/A 05/13/2019   Procedure: COLONOSCOPY WITH PROPOFOL;  Surgeon: Manya Silvas, MD;  Location[facility] END[facility]COPY;  Service: Endoscopy;  Laterality: N/A;   COLONOSCOPY WITH PROPOFOL N/A 09/25/2019   Procedure: COLONOSCOPY WITH PROPOFOL;  Surgeon: Toledo, Benay Pike, MD;  Location: [facility] ENDOSCOPY;  Service: Gastroenterology;  Laterality: N/A;   DIAGNOSTIC LAPAROSCOPY     TONSILLECTOMY      Social History   Socioeconomic History   Marital status: Legally Separated    Spouse name: Not on file   Number  of children: Not on file   Years of education: Not on file   Highest education level: Not on file  Occupational History   Not on file  Tobacco Use   Smoking status: Never   Smokeless tobacco: Never  Vaping Use   Vaping Use: Never used  Substance and Sexual Activity   Alcohol use: Yes    Alcohol/week: 4.0 standard drinks    Types: 2 Glasses of wine, 2 Cans of beer per week    Comment: OCC   Drug use: No   Sexual activity: Not on file  Other Topics Concern   Not on file  Social History Narrative   Works at [employer]; lives in [place] with daughters; never smoked; no alcohol.   Social Determinants of Health   Financial Resource  Strain: Not on file  Food Insecurity: Not on file  Transportation Needs: Not on file  Physical Activity: Not on file  Stress: Not on file  Social Connections: Not on file     FAMILY HISTORY:  We obtained a detailed, 4-generation family history.  Significant diagnoses are listed below: Family History  Problem Relation Age of Onset   Varicose Veins Mother    Breast cancer Neg Hx    Kelsey Chavez has 2 daughters (39 and 29), and 1 son (52). She has 1 sister (68), no cancer.   Kelsey Chavez mother is living at 41 with no cancer history. Patient had 1 maternal aunt, 1 maternal uncle, no cancers. No cancers in cousins or maternal grandparents.  Kelsey Chavez's father died in his late 23s due to a liver issue. Patient had 3 paternal aunts, 1 uncle, no cancers. No cancers in cousins or paternal grandparents.  Kelsey Chavez is unaware of previous family history of genetic testing for hereditary cancer risks. Patient's maternal ancestors are of [place] descent, and paternal ancestors are of [place] descent. There is no reported Ashkenazi Jewish ancestry. There is no known consanguinity.    GENETIC COUNSELING ASSESSMENT: Kelsey Chavez is a 55 y.o. female with a personal history of breast cancer which is somewhat suggestive of a hereditary cancer syndrome and predisposition to cancer. We, therefore, discussed and recommended the following at today's visit.   DISCUSSION: We discussed that approximately 10% of breast cancer is hereditary. Most cases of hereditary breast cancer are associated with BRCA1/BRCA2 genes, although there are other genes associated with hereditary breast cancer as well including. Cancers and risks are gene specific.  We discussed that testing is beneficial for several reasons including  knowing about other cancer risks, identifying potential screening and risk-reduction options that may be appropriate, and to understand if other family members could be at risk for cancer and  allow them to undergo genetic testing.   We reviewed the characteristics, features and inheritance patterns of hereditary cancer syndromes. We also discussed genetic testing, including the appropriate family members to test, the process of testing, insurance coverage and turn-around-time for results. We discussed the implications of a negative, positive and/or variant of uncertain significant result. We recommended Kelsey Chavez pursue genetic testing for the [facility] CustomNext-47+RNA gene panel.   The CustomNext-Cancer + RNAinsight panel  includes sequencing and/or deletion duplication testing of the following 47 genes: : APC, ATM, AXIN2, BARD1, BMPR1A, BRCA1, BRCA2, BRIP1, CDH1, CDKN2A (p14ARF), CDKN2A (p16INK4a), CKD4, CHEK2, CTNNA1, DICER1, EPCAM (Deletion/duplication testing only), GREM1 (promoter region deletion/duplication testing only), KIT, MEN1, MLH1, MSH2, MSH3, MSH6, MUTYH, NBN, NF1, NHTL1, PALB2, PDGFRA, PMS2, POLD1, POLE, PTEN, RAD50, RAD51C, RAD51D, SDHB, SDHC, SDHD,  SMAD4, SMARCA4. STK11, TP53, TSC1, TSC2, and VHL.  The following genes were evaluated for sequence changes only: SDHA and HOXB13 c.251G>A variant only.  Based on Kelsey Chavez's personal history of cancer, she meets medical criteria for genetic testing. Despite that she meets criteria, she may still have an out of pocket cost. We discussed that if her out of pocket cost for testing is over $100, the laboratory will call and confirm whether she wants to proceed with testing.  If the out of pocket cost of testing is less than $100 she will be billed by the genetic testing laboratory.   PLAN: After considering the risks, benefits, and limitations, Kelsey Chavez provided informed consent to pursue genetic testing and the blood sample was sent to [facility] for analysis of the CustomNext-47+RNA panel. Results should be available within approximately 2-3 weeks' time, at which point they will be disclosed by telephone to Kelsey Chavez,  as will any additional recommendations warranted by these results. Kelsey Chavez will receive a summary of her genetic counseling visit and a copy of her results once available. This information will also be available in Epic.   Kelsey Chavez questions were answered to her satisfaction today. Our contact information was provided should additional questions or concerns arise. Thank you for the referral and allowing [place] to share in the care of your patient.   Faith Rogue, MS, [facility] Genetic Counselor [email or web address] Phone: [phone]  The patient was seen for a total of 45 minutes in face-to-face genetic counseling.  Patient was seen alone. [facility] intern Raj Janus was also present and assisted with this case. Dr. Grayland Ormond was available for discussion regarding this case.   _______________________________________________________________________ For Office Staff:  Number of people involved in session: 1 Was an Intern/ student involved with case: yes

## 2021-09-16 ENCOUNTER — Inpatient Hospital Stay: Payer: 59

## 2021-09-16 ENCOUNTER — Inpatient Hospital Stay: Payer: 59 | Admitting: Licensed Clinical Social Worker

## 2021-09-29 ENCOUNTER — Telehealth: Payer: Self-pay | Admitting: Licensed Clinical Social Worker

## 2021-09-29 ENCOUNTER — Ambulatory Visit: Payer: Self-pay | Admitting: Licensed Clinical Social Worker

## 2021-09-29 ENCOUNTER — Encounter: Payer: Self-pay | Admitting: Licensed Clinical Social Worker

## 2021-09-29 DIAGNOSIS — Z1379 Encounter for other screening for genetic and chromosomal anomalies: Secondary | ICD-10-CM | POA: Insufficient documentation

## 2021-09-29 DIAGNOSIS — Z170 Estrogen receptor positive status [ER+]: Secondary | ICD-10-CM

## 2021-09-29 NOTE — Telephone Encounter (Signed)
Revealed negative genetic testing.  This normal result is reassuring and indicates that it is unlikely Kelsey Chavez's cancer is due to a hereditary cause.  It is unlikely that there is an increased risk of another cancer due to a mutation in one of these genes.  However, genetic testing is not perfect, and cannot definitively rule out a hereditary cause.  It will be important for her to keep in contact with genetics to learn if any additional testing may be needed in the future.

## 2021-09-29 NOTE — Progress Notes (Signed)
HPI:  Kelsey Chavez was previously seen in the [facility] due to a personal history of cancer and concerns regarding a hereditary predisposition to cancer. Please refer to our prior cancer genetics clinic note for more information regarding our discussion, assessment and recommendations, at the time. Kelsey Chavez recent genetic test results were disclosed to her, as were recommendations warranted by these results. These results and recommendations are discussed in more detail below.  CANCER HISTORY:  Oncology History Overview Note  # RIGHT BREAST CA- cT1b [73m]c N0; ER/PR- POSITIVE  [>90%]; her 2 NEG; GRADE-1 [Dr.Byrnett]; status postlumpectomy-grade 1; tumor size 7 mm; no LVI; clear margins-no Oncotype recommended.s/p RT.  # SEP 1st week, 2022-start tamoxifen [perimenopausal]; wean off Wellbutrin; OCT 2022- celexa                        # SURVIVORSHIP:   # GENETICS:   DIAGNOSIS:   STAGE:         ;  GOALS:  CURRENT/MOST RECENT THERAPY :       Carcinoma of upper-outer quadrant of right breast in female, estrogen receptor positive ([facility]  03/04/2021 Initial Diagnosis   Carcinoma of upper-outer quadrant of right breast in female, estrogen receptor positive ([facility]   03/04/2021 Cancer Staging   Staging form: Breast, AJCC 8th Edition - Clinical: Stage IA (cT1b, cN0, cM0, G1, ER+, PR+, HER2-) - Signed by BCammie Sickle MD on 03/04/2021 Histologic grading system: 3 grade system     Genetic Testing   Negative genetic testing. No pathogenic variants identified on the [facility] CustomNext+RNA panel. The report date is 09/27/2021.  The CustomNext-Cancer + RNAinsight panel  includes sequencing and/or deletion duplication testing of the following 47 genes: APC, ATM, AXIN2, BARD1, BMPR1A, BRCA1, BRCA2, BRIP1, CDH1, CDKN2A (p14ARF), CDKN2A (p16INK4a), CKD4, CHEK2, CTNNA1, DICER1, EPCAM (Deletion/duplication testing only), GREM1 (promoter region deletion/duplication testing  only), KIT, MEN1, MLH1, MSH2, MSH3, MSH6, MUTYH, NBN, NF1, NHTL1, PALB2, PDGFRA, PMS2, POLD1, POLE, PTEN, RAD50, RAD51C, RAD51D, SDHB, SDHC, SDHD, SMAD4, SMARCA4. STK11, TP53, TSC1, TSC2, and VHL.  The following genes were evaluated for sequence changes only: SDHA and HOXB13 c.251G>A variant only.     FAMILY HISTORY:  We obtained a detailed, 4-generation family history.  Significant diagnoses are listed below: Family History  Problem Relation Age of Onset   Varicose Veins Mother    Breast cancer Neg Hx      Ms. ECaterinohas 2 daughters (239and 149, and 1 son (2109. She has 1 sister (526, no cancer.    Ms. ESemideymother is living at 89with no cancer history. Patient had 1 maternal aunt, 1 maternal uncle, no cancers. No cancers in cousins or maternal grandparents.   Kelsey Chavez's father died in his late 678sdue to a liver issue. Patient had 3 paternal aunts, 1 uncle, no cancers. No cancers in cousins or paternal grandparents.   Ms. ESantellanis unaware of previous family history of genetic testing for hereditary cancer risks. Patient's maternal ancestors are of EMongoliadescent, and paternal ancestors are of EMongoliadescent. There is no reported Ashkenazi Jewish ancestry. There is no known consanguinity.      GENETIC TEST RESULTS: Genetic testing reported out on 09/27/2021 through the [facility] CustomNext+RNA cancer panel found no pathogenic mutations.   The CustomNext-Cancer + RNAinsight panel  includes sequencing and/or deletion duplication testing of the following 47 genes: APC, ATM, AXIN2, BARD1, BMPR1A, BRCA1, BRCA2, BRIP1, CDH1, CDKN2A (p14ARF), CDKN2A (p16INK4a),  CKD4, CHEK2, CTNNA1, DICER1, EPCAM (Deletion/duplication testing only), GREM1 (promoter region deletion/duplication testing only), KIT, MEN1, MLH1, MSH2, MSH3, MSH6, MUTYH, NBN, NF1, NHTL1, PALB2, PDGFRA, PMS2, POLD1, POLE, PTEN, RAD50, RAD51C, RAD51D, SDHB, SDHC, SDHD, SMAD4, SMARCA4. STK11, TP53, TSC1, TSC2, and VHL.  The  following genes were evaluated for sequence changes only: SDHA and HOXB13 c.251G>A variant only.   The test report has been scanned into EPIC and is located under the Molecular Pathology section of the Results Review tab.  A portion of the result report is included below for reference.     We discussed that because current genetic testing is not perfect, it is possible there may be a gene mutation in one of these genes that current testing cannot detect, but that chance is small.  There could be another gene that has not yet been discovered, or that we have not yet tested, that is responsible for the cancer diagnoses in the family. It is also possible there is a hereditary cause for the cancer in the family that Kelsey Chavez did not inherit and therefore was not identified in her testing.  Therefore, it is important to remain in touch with cancer genetics in the future so that we can continue to offer Kelsey Chavez the most up to date genetic testing.   ADDITIONAL GENETIC TESTING: We discussed with Kelsey Chavez that her genetic testing was fairly extensive.  If there are genes identified to increase cancer risk that can be analyzed in the future, we would be happy to discuss and coordinate this testing at that time.    CANCER SCREENING RECOMMENDATIONS: Kelsey Chavez test result is considered negative (normal).  This means that we have not identified a hereditary cause for her personal and family history of cancer at this time. Most cancers happen by chance and this negative test suggests that her cancer may fall into this category.    While reassuring, this does not definitively rule out a hereditary predisposition to cancer. It is still possible that there could be genetic mutations that are undetectable by current technology. There could be genetic mutations in genes that have not been tested or identified to increase cancer risk.  Therefore, it is recommended she continue to follow the cancer  management and screening guidelines provided by her oncology and primary healthcare provider.   An individual's cancer risk and medical management are not determined by genetic test results alone. Overall cancer risk assessment incorporates additional factors, including personal medical history, family history, and any available genetic information that may result in a personalized plan for cancer prevention and surveillance.  RECOMMENDATIONS FOR FAMILY MEMBERS:  Relatives in this family might be at some increased risk of developing cancer, over the general population risk, simply due to the family history of cancer.  We recommended female relatives in this family have a yearly mammogram beginning at age 66, or 72 years younger than the earliest onset of cancer, an annual clinical breast exam, and perform monthly breast self-exams. Female relatives in this family should also have a gynecological exam as recommended by their primary provider.  All family members should be referred for colonoscopy starting at age 71.   FOLLOW-UP: Lastly, we discussed with Kelsey Chavez that cancer genetics is a rapidly advancing field and it is possible that new genetic tests will be appropriate for her and/or her family members in the future. We encouraged her to remain in contact with cancer genetics on an annual basis so we can update her personal  and family histories and let her know of advances in cancer genetics that may benefit this family.   Our contact number was provided. Kelsey Chavez questions were answered to her satisfaction, and she knows she is welcome to call us at anytime with additional questions or concerns.   Kelsey Rogue, MS, [facility] Genetic Counselor [email or web address] Phone: [phone]

## 2021-09-30 ENCOUNTER — Other Ambulatory Visit: Payer: Self-pay | Admitting: Internal Medicine

## 2021-11-26 ENCOUNTER — Telehealth: Payer: Self-pay | Admitting: Internal Medicine

## 2021-11-26 NOTE — Telephone Encounter (Signed)
Pt called to reschedule her appt for 1-18. Call back at [phone]

## 2021-12-01 ENCOUNTER — Ambulatory Visit: Payer: 59 | Admitting: Internal Medicine

## 2021-12-03 ENCOUNTER — Other Ambulatory Visit: Payer: Self-pay

## 2021-12-03 ENCOUNTER — Encounter: Payer: Self-pay | Admitting: Internal Medicine

## 2021-12-03 ENCOUNTER — Inpatient Hospital Stay: Payer: Medicaid Other | Attending: Internal Medicine | Admitting: Internal Medicine

## 2021-12-03 DIAGNOSIS — C50411 Malignant neoplasm of upper-outer quadrant of right female breast: Secondary | ICD-10-CM | POA: Diagnosis not present

## 2021-12-03 DIAGNOSIS — N6310 Unspecified lump in the right breast, unspecified quadrant: Secondary | ICD-10-CM | POA: Diagnosis not present

## 2021-12-03 DIAGNOSIS — R69 Illness, unspecified: Secondary | ICD-10-CM | POA: Diagnosis not present

## 2021-12-03 DIAGNOSIS — G4733 Obstructive sleep apnea (adult) (pediatric): Secondary | ICD-10-CM | POA: Diagnosis not present

## 2021-12-03 DIAGNOSIS — Z170 Estrogen receptor positive status [ER+]: Secondary | ICD-10-CM | POA: Diagnosis not present

## 2021-12-03 DIAGNOSIS — Z79810 Long term (current) use of selective estrogen receptor modulators (SERMs): Secondary | ICD-10-CM | POA: Insufficient documentation

## 2021-12-03 DIAGNOSIS — Z6832 Body mass index (BMI) 32.0-32.9, adult: Secondary | ICD-10-CM | POA: Diagnosis not present

## 2021-12-03 DIAGNOSIS — R8290 Unspecified abnormal findings in urine: Secondary | ICD-10-CM | POA: Diagnosis not present

## 2021-12-03 MED ORDER — TAMOXIFEN CITRATE 20 MG PO TABS: 20.0000 mg | ORAL_TABLET | Freq: Every day | ORAL | 1 refills | Status: DC

## 2021-12-03 NOTE — Progress Notes (Signed)
[facility] NOTE  Patient Care Team: Tracie Harrier, MD as PCP - General (Internal Medicine) Rico Junker, RN as Oncology Nurse Navigator Cammie Sickle, MD as Consulting Physician (Internal Medicine) Noreene Filbert, MD as Consulting Physician (Radiation Oncology) Bary Castilla Forest Gleason, MD as Consulting Physician (General Surgery)  CHIEF COMPLAINTS/PURPOSE OF CONSULTATION: Breast cancer  #  Oncology History Overview Note  # RIGHT BREAST CA- cT1b [71m]c N0; ER/PR- POSITIVE  [>90%]; her 2 NEG; GRADE-1 [Dr.Byrnett]; status postlumpectomy-grade 1; tumor size 7 mm; no LVI; clear margins-no Oncotype recommended.s/p RT.  # SEP 1st week, 2022-start tamoxifen [perimenopausal]; wean off Wellbutrin; OCT 2022- celexa                        # SURVIVORSHIP:   # GENETICS:   DIAGNOSIS:   STAGE:         ;  GOALS:  CURRENT/MOST RECENT THERAPY :       Carcinoma of upper-outer quadrant of right breast in female, estrogen receptor positive ([facility]  03/04/2021 Initial Diagnosis   Carcinoma of upper-outer quadrant of right breast in female, estrogen receptor positive ([facility]   03/04/2021 Cancer Staging   Staging form: Breast, AJCC 8th Edition - Clinical: Stage IA (cT1b, cN0, cM0, G1, ER+, PR+, HER2-) - Signed by BCammie Sickle MD on 03/04/2021 Histologic grading system: 3 grade system     Genetic Testing   Negative genetic testing. No pathogenic variants identified on the [facility] CustomNext+RNA panel. The report date is 09/27/2021.  The CustomNext-Cancer + RNAinsight panel  includes sequencing and/or deletion duplication testing of the following 47 genes: APC, ATM, AXIN2, BARD1, BMPR1A, BRCA1, BRCA2, BRIP1, CDH1, CDKN2A (p14ARF), CDKN2A (p16INK4a), CKD4, CHEK2, CTNNA1, DICER1, EPCAM (Deletion/duplication testing only), GREM1 (promoter region deletion/duplication testing only), KIT, MEN1, MLH1, MSH2, MSH3, MSH6, MUTYH, NBN, NF1, NHTL1, PALB2, PDGFRA, PMS2, POLD1,  POLE, PTEN, RAD50, RAD51C, RAD51D, SDHB, SDHC, SDHD, SMAD4, SMARCA4. STK11, TP53, TSC1, TSC2, and VHL.  The following genes were evaluated for sequence changes only: SDHA and HOXB13 c.251G>A variant only.      HISTORY OF PRESENTING ILLNESS: Alone.  Ambulating independently.  [name]89y.o.  female patient with recent history of stage I ER/PR positive HER2 negative breast cancer-currently tamoxifen is here for follow-up.  S/p evaluation with genetics.  Patient denies any worsening hot flashes.  Denies any significant weight gain.   Review of Systems  Constitutional:  Negative for chills, diaphoresis, fever, malaise/fatigue and weight loss.  HENT:  Negative for nosebleeds and sore throat.   Eyes:  Negative for double vision.  Respiratory:  Negative for cough, hemoptysis, sputum production, shortness of breath and wheezing.   Cardiovascular:  Negative for chest pain, palpitations, orthopnea and leg swelling.  Gastrointestinal:  Negative for abdominal pain, blood in stool, constipation, diarrhea, heartburn, melena, nausea and vomiting.  Genitourinary:  Negative for dysuria, frequency and urgency.  Musculoskeletal:  Negative for back pain and joint pain.  Skin: Negative.  Negative for itching and rash.  Neurological:  Negative for dizziness, tingling, focal weakness, weakness and headaches.  Endo/Heme/Allergies:  Does not bruise/bleed easily.  Psychiatric/Behavioral:  Negative for depression. The patient is not nervous/anxious and does not have insomnia.     MEDICAL HISTORY:  Past Medical History:  Diagnosis Date   [place]    breast   Depression    Varicose veins of both lower extremities    Vertigo     SURGICAL HISTORY: Past Surgical  History:  Procedure Laterality Date   BREAST BIOPSY Right 02/24/2021   u/s bx "   BREAST LUMPECTOMY WITH SENTINEL LYMPH NODE BIOPSY Right 03/24/2021   Procedure: BREAST LUMPECTOMY WITH SENTINEL LYMPH NODE BX;  Surgeon: Robert Bellow, MD;  Location: [facility] ORS;  Service: General;  Laterality: Right;   [place]     COLONOSCOPY WITH PROPOFOL N/A 05/13/2019   Procedure: COLONOSCOPY WITH PROPOFOL;  Surgeon: Manya Silvas, MD;  Location[facility] ENDOSCOPY;  Service: Endoscopy;  Laterality: N/A;   COLONOSCOPY WITH PROPOFOL N/A 09/25/2019   Procedure: COLONOSCOPY WITH PROPOFOL;  Surgeon: Toledo, Benay Pike, MD;  Location: [facility] ENDOSCOPY;  Service: Gastroenterology;  Laterality: N/A;   DIAGNOSTIC LAPAROSCOPY     TONSILLECTOMY      SOCIAL HISTORY: Social History   Socioeconomic History   Marital status: Legally Separated    Spouse name: Not on file   Number of children: Not on file   Years of education: Not on file   Highest education level: Not on file  Occupational History   Not on file  Tobacco Use   Smoking status: Never   Smokeless tobacco: Never  Vaping Use   Vaping Use: Never used  Substance and Sexual Activity   Alcohol use: Yes    Alcohol/week: 4.0 standard drinks    Types: 2 Glasses of wine, 2 Cans of beer per week    Comment: OCC   Drug use: No   Sexual activity: Not on file  Other Topics Concern   Not on file  Social History Narrative   Works at [employer]; lives in [place] with daughters; never smoked; no alcohol.   Social Determinants of Health   Financial Resource Strain: Not on file  Food Insecurity: Not on file  Transportation Needs: Not on file  Physical Activity: Not on file  Stress: Not on file  Social Connections: Not on file  Intimate Partner Violence: Not on file    FAMILY HISTORY: Family History  Problem Relation Age of Onset   Varicose Veins Mother    Breast cancer Neg Hx     ALLERGIES:  is allergic to aspirin, shrimp extract allergy skin test, and shrimp [shellfish allergy].  MEDICATIONS:  Current Outpatient Medications  Medication Sig Dispense Refill   Semaglutide (RYBELSUS) 14 MG TABS Take 14 mg by mouth every morning. PT TAKES  FOR WEIGHT LOSS ONLY     citalopram (CELEXA) 20 MG tablet TAKE 1 TABLET BY MOUTH EVERY DAY (Patient not taking: Reported on 12/03/2021) 90 tablet 2   tamoxifen (NOLVADEX) 20 MG tablet Take 1 tablet (20 mg total) by mouth daily. 90 tablet 1   No current facility-administered medications for this visit.      Marland Kitchen  PHYSICAL EXAMINATION: ECOG PERFORMANCE STATUS: 0 - Asymptomatic  Vitals:   12/03/21 0841  BP: (!) 91/56  Pulse: 70  Temp: (!) 96.9 F (36.1 C)  SpO2: 100%   Filed Weights   12/03/21 0841  Weight: 169 lb 3.2 oz (76.7 kg)    Physical Exam HENT:     Head: Normocephalic and atraumatic.     Mouth/Throat:     Pharynx: No oropharyngeal exudate.  Eyes:     Pupils: Pupils are equal, round, and reactive to light.  Cardiovascular:     Rate and Rhythm: Normal rate and regular rhythm.  Pulmonary:     Effort: Pulmonary effort is normal. No respiratory distress.     Breath sounds: Normal breath sounds.  No wheezing.  Abdominal:     General: Bowel sounds are normal. There is no distension.     Palpations: Abdomen is soft. There is no mass.     Tenderness: There is no abdominal tenderness. There is no guarding or rebound.  Musculoskeletal:        General: No tenderness. Normal range of motion.     Cervical back: Normal range of motion and neck supple.  Skin:    General: Skin is warm.  Neurological:     Mental Status: She is alert and oriented to person, place, and time.  Psychiatric:        Mood and Affect: Affect normal.     LABORATORY DATA:  I have reviewed the data as listed Lab Results  Component Value Date   WBC 4.3 05/21/2021   HGB 12.3 05/21/2021   HCT 37.4 05/21/2021   MCV 93.0 05/21/2021   PLT 226 05/21/2021   No results for input(s): NA, K, CL, CO2, GLUCOSE, BUN, CREATININE, CALCIUM, GFRNONAA, GFRAA, PROT, ALBUMIN, AST, ALT, ALKPHOS, BILITOT, BILIDIR, IBILI in the last 8760 hours.  RADIOGRAPHIC STUDIES: I have personally reviewed the radiological images  as listed and agreed with the findings in the report. No results found.   ASSESSMENT & PLAN:   Carcinoma of upper-outer quadrant of right breast in female, estrogen receptor positive ([place]) #Breast cancer stage I ER/PR positive HER2 negative] postlumpectomy T1b; grade 1; no LVI] [low risk; NO oncotype; no chemo]. currently on Tamoxifen [started sep 1st, 2022]; [id number].  Recommend mammogram in May 2023/follow-up with Dr. Bary Castilla.  Prescription of tamoxifen refill.  Continue tamoxifen tolerating well.Marland Kitchen   #History of depression- not taking Celexa 20 mg/day-insomnia-not any worse.  Stable.  # GENETICS: s/p genetics-reviewed the genetics negative for any deleterious mutations.  WNL.  # DISPOSITION: # diagnostic  bil mammogram in MAY 2023 # follow up in 6 months- MD--Dr.B  All questions were answered. The patient/family knows to call the clinic with any problems, questions or concerns.    Cammie Sickle, MD 12/03/2021 9:21 AM

## 2021-12-03 NOTE — Assessment & Plan Note (Addendum)
#  Breast cancer stage I ER/PR positive HER2 negative] postlumpectomy T1b; grade 1; no LVI] [low risk; NO oncotype; no chemo]. currently on Tamoxifen [started sep 1st, 2022]; [id number].  Recommend mammogram in May 2023/follow-up with Dr. Bary Castilla.  Prescription of tamoxifen refill.  Continue tamoxifen tolerating well.Marland Kitchen   #History of depression- not taking Celexa 20 mg/day-insomnia-not any worse.  Stable.  # GENETICS: s/p genetics-reviewed the genetics negative for any deleterious mutations.  WNL.  # DISPOSITION: # diagnostic  bil mammogram in MAY 2023 # follow up in 6 months- MD--Dr.B

## 2021-12-03 NOTE — Patient Instructions (Signed)
#   make appointment with Dr. Bary Castilla in May/after the mammograms.

## 2021-12-10 DIAGNOSIS — I83811 Varicose veins of right lower extremities with pain: Secondary | ICD-10-CM | POA: Diagnosis not present

## 2021-12-10 DIAGNOSIS — C50411 Malignant neoplasm of upper-outer quadrant of right female breast: Secondary | ICD-10-CM | POA: Diagnosis not present

## 2021-12-10 DIAGNOSIS — Z170 Estrogen receptor positive status [ER+]: Secondary | ICD-10-CM | POA: Diagnosis not present

## 2021-12-10 DIAGNOSIS — Z6832 Body mass index (BMI) 32.0-32.9, adult: Secondary | ICD-10-CM | POA: Diagnosis not present

## 2021-12-13 ENCOUNTER — Ambulatory Visit: Payer: 59 | Admitting: Radiation Oncology

## 2021-12-15 ENCOUNTER — Ambulatory Visit: Payer: 59 | Admitting: Radiation Oncology

## 2021-12-16 ENCOUNTER — Other Ambulatory Visit: Payer: Self-pay | Admitting: Internal Medicine

## 2021-12-16 DIAGNOSIS — C50411 Malignant neoplasm of upper-outer quadrant of right female breast: Secondary | ICD-10-CM

## 2021-12-20 ENCOUNTER — Ambulatory Visit: Payer: 59 | Admitting: Radiation Oncology

## 2021-12-20 ENCOUNTER — Other Ambulatory Visit: Payer: Self-pay

## 2021-12-20 ENCOUNTER — Ambulatory Visit
Admission: RE | Admit: 2021-12-20 | Discharge: 2021-12-20 | Disposition: A | Discharge status: Home / Self Care | Payer: 59 | Source: Ambulatory Visit | Attending: Radiation Oncology | Admitting: Radiation Oncology

## 2021-12-20 VITALS — BP 102/47 | HR 66 | Temp 97.0°F | Resp 18 | Ht 60.0 in | Wt 168.3 lb

## 2021-12-20 DIAGNOSIS — Z79810 Long term (current) use of selective estrogen receptor modulators (SERMs): Secondary | ICD-10-CM | POA: Insufficient documentation

## 2021-12-20 DIAGNOSIS — Z170 Estrogen receptor positive status [ER+]: Secondary | ICD-10-CM | POA: Insufficient documentation

## 2021-12-20 DIAGNOSIS — C50412 Malignant neoplasm of upper-outer quadrant of left female breast: Secondary | ICD-10-CM | POA: Diagnosis not present

## 2021-12-20 DIAGNOSIS — Z08 Encounter for follow-up examination after completed treatment for malignant neoplasm: Secondary | ICD-10-CM | POA: Diagnosis not present

## 2021-12-20 DIAGNOSIS — C50411 Malignant neoplasm of upper-outer quadrant of right female breast: Secondary | ICD-10-CM | POA: Insufficient documentation

## 2021-12-20 DIAGNOSIS — Z923 Personal history of irradiation: Secondary | ICD-10-CM | POA: Diagnosis not present

## 2021-12-20 NOTE — Progress Notes (Signed)
Radiation Oncology Follow up Note  Name: Kelsey Chavez   Date:   12/20/2021 MRN:  [id number] DOB: [birth year]    This 56 y.o. female presents to the clinic today for 87-month follow-up status post whole breast radiation to her right breast for stage Ia ER.  PR positive invasive mammary carcinoma.  REFERRING PROVIDER: Tracie Harrier, MD  HPI: Patient is a 56 year old female now out 6 months having completed whole breast radiation to her right breast for an ER/PR positive stage Ia invasive mammary carcinoma seen today in routine follow-up she still having some tenderness mostly in the tail of her right breast.  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  She otherwise specifically denies cough or bone pain..  She has not yet had mammograms.  She is currently on tamoxifen tolerating well without side effect.  COMPLICATIONS OF TREATMENT: none  FOLLOW UP COMPLIANCE: keeps appointments   PHYSICAL EXAM:  BP (!) 102/47    Pulse 66    Temp (!) 97 F (36.1 C)    Resp 18    Ht 5' (1.524 m)    Wt 168 lb 4.8 oz (76.3 kg)    BMI 32.87 kg/m  Lungs are clear to A&P cardiac examination essentially unremarkable with regular rate and rhythm. No dominant mass or nodularity is noted in either breast in 2 positions examined. Incision is well-healed. No axillary or supraclavicular adenopathy is appreciated. Cosmetic result is excellent.  Well-developed well-nourished patient in NAD. HEENT reveals PERLA, EOMI, discs not visualized.  Oral cavity is clear. No oral mucosal lesions are identified. Neck is clear without evidence of cervical or supraclavicular adenopathy. Lungs are clear to A&P. Cardiac examination is essentially unremarkable with regular rate and rhythm without murmur rub or thrill. Abdomen is benign with no organomegaly or masses noted. Motor sensory and DTR levels are equal and symmetric in the upper and lower extremities. Cranial nerves II through XII are grossly intact. Proprioception  is intact. No peripheral adenopathy or edema is identified. No motor or sensory levels are noted. Crude visual fields are within normal range.  RADIOLOGY RESULTS: No current films to review  PLAN: Present time patient is doing well 6 months out with no evidence of disease.  I am pleased with her overall progress.  Of asked to see her back in 6 months for follow-up.  She will have a mammogram by that time.  She has a scheduled appointment with Dr. Bary Castilla for evaluation of her breast tenderness although I have assured her that is common after breast surgery.  Patient knows to call with any concerns.  I would like to take this opportunity to thank you for allowing me to participate in the care of your patient.Noreene Filbert, MD

## 2021-12-23 ENCOUNTER — Other Ambulatory Visit: Payer: Self-pay | Admitting: General Surgery

## 2021-12-23 DIAGNOSIS — M792 Neuralgia and neuritis, unspecified: Secondary | ICD-10-CM | POA: Diagnosis not present

## 2021-12-23 DIAGNOSIS — C50411 Malignant neoplasm of upper-outer quadrant of right female breast: Secondary | ICD-10-CM

## 2021-12-27 ENCOUNTER — Other Ambulatory Visit: Payer: Self-pay | Admitting: General Surgery

## 2021-12-27 DIAGNOSIS — N871 Moderate cervical dysplasia: Secondary | ICD-10-CM | POA: Diagnosis not present

## 2021-12-27 DIAGNOSIS — N898 Other specified noninflammatory disorders of vagina: Secondary | ICD-10-CM | POA: Diagnosis not present

## 2021-12-27 DIAGNOSIS — R87612 Low grade squamous intraepithelial lesion on cytologic smear of cervix (LGSIL): Secondary | ICD-10-CM | POA: Diagnosis not present

## 2021-12-27 DIAGNOSIS — R69 Illness, unspecified: Secondary | ICD-10-CM | POA: Diagnosis not present

## 2021-12-27 DIAGNOSIS — N870 Mild cervical dysplasia: Secondary | ICD-10-CM | POA: Diagnosis not present

## 2021-12-27 DIAGNOSIS — Z170 Estrogen receptor positive status [ER+]: Secondary | ICD-10-CM

## 2022-01-26 ENCOUNTER — Other Ambulatory Visit: Payer: Medicaid Other

## 2022-02-03 DIAGNOSIS — N871 Moderate cervical dysplasia: Secondary | ICD-10-CM | POA: Diagnosis not present

## 2022-02-11 ENCOUNTER — Ambulatory Visit
Admission: RE | Admit: 2022-02-11 | Discharge: 2022-02-11 | Disposition: A | Discharge status: Home / Self Care | Payer: 59 | Source: Ambulatory Visit | Attending: General Surgery | Admitting: General Surgery

## 2022-02-11 DIAGNOSIS — Z170 Estrogen receptor positive status [ER+]: Secondary | ICD-10-CM | POA: Diagnosis not present

## 2022-02-11 DIAGNOSIS — C50411 Malignant neoplasm of upper-outer quadrant of right female breast: Secondary | ICD-10-CM

## 2022-02-11 DIAGNOSIS — R922 Inconclusive mammogram: Secondary | ICD-10-CM | POA: Diagnosis not present

## 2022-03-03 DIAGNOSIS — N870 Mild cervical dysplasia: Secondary | ICD-10-CM | POA: Diagnosis not present

## 2022-03-03 DIAGNOSIS — N871 Moderate cervical dysplasia: Secondary | ICD-10-CM | POA: Diagnosis not present

## 2022-04-25 DIAGNOSIS — M9905 Segmental and somatic dysfunction of pelvic region: Secondary | ICD-10-CM | POA: Diagnosis not present

## 2022-04-25 DIAGNOSIS — M9903 Segmental and somatic dysfunction of lumbar region: Secondary | ICD-10-CM | POA: Diagnosis not present

## 2022-04-25 DIAGNOSIS — M5416 Radiculopathy, lumbar region: Secondary | ICD-10-CM | POA: Diagnosis not present

## 2022-04-25 DIAGNOSIS — M955 Acquired deformity of pelvis: Secondary | ICD-10-CM | POA: Diagnosis not present

## 2022-04-27 DIAGNOSIS — M955 Acquired deformity of pelvis: Secondary | ICD-10-CM | POA: Diagnosis not present

## 2022-04-27 DIAGNOSIS — M9905 Segmental and somatic dysfunction of pelvic region: Secondary | ICD-10-CM | POA: Diagnosis not present

## 2022-04-27 DIAGNOSIS — M9903 Segmental and somatic dysfunction of lumbar region: Secondary | ICD-10-CM | POA: Diagnosis not present

## 2022-04-27 DIAGNOSIS — M5416 Radiculopathy, lumbar region: Secondary | ICD-10-CM | POA: Diagnosis not present

## 2022-04-29 DIAGNOSIS — M5416 Radiculopathy, lumbar region: Secondary | ICD-10-CM | POA: Diagnosis not present

## 2022-04-29 DIAGNOSIS — M48061 Spinal stenosis, lumbar region without neurogenic claudication: Secondary | ICD-10-CM | POA: Diagnosis not present

## 2022-04-29 DIAGNOSIS — G4733 Obstructive sleep apnea (adult) (pediatric): Secondary | ICD-10-CM | POA: Diagnosis not present

## 2022-04-29 DIAGNOSIS — M9903 Segmental and somatic dysfunction of lumbar region: Secondary | ICD-10-CM | POA: Diagnosis not present

## 2022-04-29 DIAGNOSIS — M9905 Segmental and somatic dysfunction of pelvic region: Secondary | ICD-10-CM | POA: Diagnosis not present

## 2022-04-29 DIAGNOSIS — Z170 Estrogen receptor positive status [ER+]: Secondary | ICD-10-CM | POA: Diagnosis not present

## 2022-04-29 DIAGNOSIS — M5441 Lumbago with sciatica, right side: Secondary | ICD-10-CM | POA: Diagnosis not present

## 2022-04-29 DIAGNOSIS — R7309 Other abnormal glucose: Secondary | ICD-10-CM | POA: Diagnosis not present

## 2022-04-29 DIAGNOSIS — M5137 Other intervertebral disc degeneration, lumbosacral region: Secondary | ICD-10-CM | POA: Diagnosis not present

## 2022-04-29 DIAGNOSIS — Z6835 Body mass index (BMI) 35.0-35.9, adult: Secondary | ICD-10-CM | POA: Diagnosis not present

## 2022-04-29 DIAGNOSIS — C50411 Malignant neoplasm of upper-outer quadrant of right female breast: Secondary | ICD-10-CM | POA: Diagnosis not present

## 2022-04-29 DIAGNOSIS — M955 Acquired deformity of pelvis: Secondary | ICD-10-CM | POA: Diagnosis not present

## 2022-04-29 DIAGNOSIS — R7303 Prediabetes: Secondary | ICD-10-CM | POA: Diagnosis not present

## 2022-05-02 DIAGNOSIS — M9905 Segmental and somatic dysfunction of pelvic region: Secondary | ICD-10-CM | POA: Diagnosis not present

## 2022-05-02 DIAGNOSIS — M5416 Radiculopathy, lumbar region: Secondary | ICD-10-CM | POA: Diagnosis not present

## 2022-05-02 DIAGNOSIS — M9903 Segmental and somatic dysfunction of lumbar region: Secondary | ICD-10-CM | POA: Diagnosis not present

## 2022-05-02 DIAGNOSIS — M955 Acquired deformity of pelvis: Secondary | ICD-10-CM | POA: Diagnosis not present

## 2022-05-06 DIAGNOSIS — M5416 Radiculopathy, lumbar region: Secondary | ICD-10-CM | POA: Diagnosis not present

## 2022-05-06 DIAGNOSIS — M9903 Segmental and somatic dysfunction of lumbar region: Secondary | ICD-10-CM | POA: Diagnosis not present

## 2022-05-06 DIAGNOSIS — M9905 Segmental and somatic dysfunction of pelvic region: Secondary | ICD-10-CM | POA: Diagnosis not present

## 2022-05-06 DIAGNOSIS — M955 Acquired deformity of pelvis: Secondary | ICD-10-CM | POA: Diagnosis not present

## 2022-06-02 ENCOUNTER — Other Ambulatory Visit: Payer: Self-pay

## 2022-06-02 ENCOUNTER — Other Ambulatory Visit: Payer: Self-pay | Admitting: Internal Medicine

## 2022-06-08 DIAGNOSIS — R7303 Prediabetes: Secondary | ICD-10-CM | POA: Diagnosis not present

## 2022-06-08 DIAGNOSIS — Z0000 Encounter for general adult medical examination without abnormal findings: Secondary | ICD-10-CM | POA: Diagnosis not present

## 2022-06-20 ENCOUNTER — Ambulatory Visit
Admission: RE | Admit: 2022-06-20 | Discharge: 2022-06-20 | Disposition: A | Discharge status: Home / Self Care | Payer: 59 | Source: Ambulatory Visit | Attending: Radiation Oncology | Admitting: Radiation Oncology

## 2022-06-20 ENCOUNTER — Encounter: Payer: Self-pay | Admitting: Radiation Oncology

## 2022-06-20 VITALS — BP 108/69 | HR 71 | Temp 97.5°F | Resp 16 | Wt 191.0 lb

## 2022-06-20 DIAGNOSIS — Z79810 Long term (current) use of selective estrogen receptor modulators (SERMs): Secondary | ICD-10-CM | POA: Insufficient documentation

## 2022-06-20 DIAGNOSIS — Z923 Personal history of irradiation: Secondary | ICD-10-CM | POA: Insufficient documentation

## 2022-06-20 DIAGNOSIS — C50411 Malignant neoplasm of upper-outer quadrant of right female breast: Secondary | ICD-10-CM | POA: Insufficient documentation

## 2022-06-20 DIAGNOSIS — Z170 Estrogen receptor positive status [ER+]: Secondary | ICD-10-CM | POA: Diagnosis not present

## 2022-06-20 NOTE — Progress Notes (Signed)
Radiation Oncology Follow up Note  Name: Kelsey Chavez   Date:   06/20/2022 MRN:  [id number] DOB: [birth year]    This 56 y.o. female presents to the clinic today for 1 year follow-up status post whole breast radiation to her right breast for stage I ER positive invasive mammary carcinoma.  REFERRING PROVIDER: Tracie Harrier, MD  HPI: Patient is a 56 year old female now out 1 year having completed whole breast radiation to her right breast for stage Ia ER positive invasive mammary carcinoma.  Seen today in routine follow-up she is doing well.  She specifically denies breast tenderness cough or bone pain..  She is currently on tamoxifen she may attribute some of that to weight gain.  She had mammograms back in March which I have reviewed were BI-RADS 2 benign.  COMPLICATIONS OF TREATMENT: none  FOLLOW UP COMPLIANCE: keeps appointments   PHYSICAL EXAM:  BP 108/69 (BP Location: Left Arm, Patient Position: Sitting, Cuff Size: Large)   Pulse 71   Temp (!) 97.5 F (36.4 C) (Tympanic)   Resp 16   Wt 191 lb (86.6 kg)   BMI 37.30 kg/m  Lungs are clear to A&P cardiac examination essentially unremarkable with regular rate and rhythm. No dominant mass or nodularity is noted in either breast in 2 positions examined. Incision is well-healed. No axillary or supraclavicular adenopathy is appreciated. Cosmetic result is excellent.  Well-developed well-nourished patient in NAD. HEENT reveals PERLA, EOMI, discs not visualized.  Oral cavity is clear. No oral mucosal lesions are identified. Neck is clear without evidence of cervical or supraclavicular adenopathy. Lungs are clear to A&P. Cardiac examination is essentially unremarkable with regular rate and rhythm without murmur rub or thrill. Abdomen is benign with no organomegaly or masses noted. Motor sensory and DTR levels are equal and symmetric in the upper and lower extremities. Cranial nerves II through XII are grossly intact. Proprioception is intact.  No peripheral adenopathy or edema is identified. No motor or sensory levels are noted. Crude visual fields are within normal range.  RADIOLOGY RESULTS: Mammograms reviewed compatible with above-stated findings  PLAN: Present time patient is doing well with no evidence of disease 1 year out from whole breast radiation and pleased with her overall progress.  I have asked to see her back in 1 year for follow-up.  Patient is to call with any concerns.  I would like to take this opportunity to thank you for allowing me to participate in the care of your patient.Noreene Filbert, MD

## 2022-06-21 ENCOUNTER — Inpatient Hospital Stay: Payer: 59 | Attending: Internal Medicine | Admitting: Internal Medicine

## 2022-06-21 ENCOUNTER — Encounter: Payer: Self-pay | Admitting: Internal Medicine

## 2022-06-21 DIAGNOSIS — R635 Abnormal weight gain: Secondary | ICD-10-CM | POA: Insufficient documentation

## 2022-06-21 DIAGNOSIS — Z170 Estrogen receptor positive status [ER+]: Secondary | ICD-10-CM | POA: Diagnosis not present

## 2022-06-21 DIAGNOSIS — F32A Depression, unspecified: Secondary | ICD-10-CM | POA: Insufficient documentation

## 2022-06-21 DIAGNOSIS — Z79810 Long term (current) use of selective estrogen receptor modulators (SERMs): Secondary | ICD-10-CM | POA: Insufficient documentation

## 2022-06-21 DIAGNOSIS — C50411 Malignant neoplasm of upper-outer quadrant of right female breast: Secondary | ICD-10-CM | POA: Insufficient documentation

## 2022-06-21 NOTE — Progress Notes (Signed)
Dr. Jacinto Reap suggests [facility] they are not accepting her ins at this time. Medicaid requires ref to be done through pcp as well.

## 2022-06-21 NOTE — Progress Notes (Signed)
Concerns of continued wt gain while taking Tamoxifen.  Patient reports abnormal pap in 02/2022 that is followed by [facility]

## 2022-06-21 NOTE — Assessment & Plan Note (Addendum)
#  Breast cancer stage I ER/PR positive HER2 negative] postlumpectomy T1b; grade 1; no LVI] [low risk; NO oncotype; no chemo]. currently on Tamoxifen [started sep 1st, 2022]; [id number].  MARCH 2023-BIL mammogram- WNL [Dr.Byrnett].    Continue tamoxifen tolerating well except for weight gain.   #I discussed option of switching over to aromatase inhibitor plus Lupron vs. continued tamoxifen.  Patient not interested in changing any therapy at this time.  # Weight gain- from tamoxifen [20 pounds in 6 months];  Discussed importance of healthy weight/and weight loss.  Strongly recommend eating more green leafy vegetables and cutting down processed food/ carbohydrates.  Instead increasing whole grains / protein in the diet.  Will refer the patient to weight loss clinic.  #History of depression- not taking Celexa 20 mg/day-insomnia-not any worse.  Stable.  # DISPOSITION: # referral to [place] weight loss management program re: weight gain/obesity tamoxifen  # follow up in 6 months- MD--Dr.B  Addendum: Unfortunately, patient's insurance does not cover a referral to weight loss clinic.

## 2022-06-21 NOTE — Progress Notes (Signed)
[facility] NOTE  Patient Care Team: Tracie Harrier, MD as PCP - General (Internal Medicine) Rico Junker, RN as Oncology Nurse Navigator Cammie Sickle, MD as Consulting Physician (Internal Medicine) Noreene Filbert, MD as Consulting Physician (Radiation Oncology) Bary Castilla Forest Gleason, MD as Consulting Physician (General Surgery)  CHIEF COMPLAINTS/PURPOSE OF CONSULTATION: Breast cancer  #  Oncology History Overview Note  # RIGHT BREAST CA- cT1b [73m]c N0; ER/PR- POSITIVE  [>90%]; her 2 NEG; GRADE-1 [Dr.Byrnett]; status postlumpectomy-grade 1; tumor size 7 mm; no LVI; clear margins-no Oncotype recommended.s/p RT.  # SEP 1st week, 2022-start tamoxifen [perimenopausal]; wean off Wellbutrin; OCT 2022- celexa                        # SURVIVORSHIP:   # GENETICS:   DIAGNOSIS:   STAGE:         ;  GOALS:  CURRENT/MOST RECENT THERAPY :       Carcinoma of upper-outer quadrant of right breast in female, estrogen receptor positive ([place]  03/04/2021 Initial Diagnosis   Carcinoma of upper-outer quadrant of right breast in female, estrogen receptor positive ([place]   03/04/2021 Cancer Staging   Staging form: Breast, AJCC 8th Edition - Clinical: Stage IA (cT1b, cN0, cM0, G1, ER+, PR+, HER2-) - Signed by BCammie Sickle MD on 03/04/2021 Histologic grading system: 3 grade system    Genetic Testing   Negative genetic testing. No pathogenic variants identified on the [facility] CustomNext+RNA panel. The report date is 09/27/2021.  The CustomNext-Cancer + RNAinsight panel  includes sequencing and/or deletion duplication testing of the following 47 genes: APC, ATM, AXIN2, BARD1, BMPR1A, BRCA1, BRCA2, BRIP1, CDH1, CDKN2A (p14ARF), CDKN2A (p16INK4a), CKD4, CHEK2, CTNNA1, DICER1, EPCAM (Deletion/duplication testing only), GREM1 (promoter region deletion/duplication testing only), KIT, MEN1, MLH1, MSH2, MSH3, MSH6, MUTYH, NBN, NF1, NHTL1, PALB2, PDGFRA, PMS2, POLD1,  POLE, PTEN, RAD50, RAD51C, RAD51D, SDHB, SDHC, SDHD, SMAD4, SMARCA4. STK11, TP53, TSC1, TSC2, and VHL.  The following genes were evaluated for sequence changes only: SDHA and HOXB13 c.251G>A variant only.      HISTORY OF PRESENTING ILLNESS: Alone.  Ambulating independently.  [name]89y.o.  female patient with recent history of stage I ER/PR positive HER2 negative breast cancer-currently tamoxifen is here for follow-up.  Complains fof  weight gain.   Patient denies any worsening hot flashes.  Denies any significant weight gain.   Review of Systems  Constitutional:  Negative for chills, diaphoresis, fever, malaise/fatigue and weight loss.  HENT:  Negative for nosebleeds and sore throat.   Eyes:  Negative for double vision.  Respiratory:  Negative for cough, hemoptysis, sputum production, shortness of breath and wheezing.   Cardiovascular:  Negative for chest pain, palpitations, orthopnea and leg swelling.  Gastrointestinal:  Negative for abdominal pain, blood in stool, constipation, diarrhea, heartburn, melena, nausea and vomiting.  Genitourinary:  Negative for dysuria, frequency and urgency.  Musculoskeletal:  Negative for back pain and joint pain.  Skin: Negative.  Negative for itching and rash.  Neurological:  Negative for dizziness, tingling, focal weakness, weakness and headaches.  Endo/Heme/Allergies:  Does not bruise/bleed easily.  Psychiatric/Behavioral:  Negative for depression. The patient is not nervous/anxious and does not have insomnia.      MEDICAL HISTORY:  Past Medical History:  Diagnosis Date   [place]    breast   Depression    Varicose veins of both lower extremities    Vertigo     SURGICAL HISTORY:  Past Surgical History:  Procedure Laterality Date   BREAST BIOPSY Right 02/24/2021   u/s bx "   BREAST LUMPECTOMY     BREAST LUMPECTOMY WITH SENTINEL LYMPH NODE BIOPSY Right 03/24/2021   Procedure: BREAST LUMPECTOMY WITH SENTINEL LYMPH NODE BX;   Surgeon: Robert Bellow, MD;  Location: [facility] ORS;  Service: General;  Laterality: Right;   [place]     COLONOSCOPY WITH PROPOFOL N/A 05/13/2019   Procedure: COLONOSCOPY WITH PROPOFOL;  Surgeon: Manya Silvas, MD;  Location: [facility] ENDOSCOPY;  Service: Endoscopy;  Laterality: N/A;   COLONOSCOPY WITH PROPOFOL N/A 09/25/2019   Procedure: COLONOSCOPY WITH PROPOFOL;  Surgeon: Toledo, Benay Pike, MD;  Location: [facility] ENDOSCOPY;  Service: Gastroenterology;  Laterality: N/A;   DIAGNOSTIC LAPAROSCOPY     TONSILLECTOMY      SOCIAL HISTORY: Social History   Socioeconomic History   Marital status: Legally Separated    Spouse name: Not on file   Number of children: Not on file   Years of education: Not on file   Highest education level: Not on file  Occupational History   Not on file  Tobacco Use   Smoking status: Never   Smokeless tobacco: Never  Vaping Use   Vaping Use: Never used  Substance and Sexual Activity   Alcohol use: Yes    Alcohol/week: 4.0 standard drinks of alcohol    Types: 2 Glasses of wine, 2 Cans of beer per week    Comment: OCC   Drug use: No   Sexual activity: Not on file  Other Topics Concern   Not on file  Social History Narrative   Works at [employer]; lives in [place] with daughters; never smoked; no alcohol.   Social Determinants of Health   Financial Resource Strain: Not on file  Food Insecurity: Not on file  Transportation Needs: Not on file  Physical Activity: Not on file  Stress: Not on file  Social Connections: Not on file  Intimate Partner Violence: Not on file    FAMILY HISTORY: Family History  Problem Relation Age of Onset   Varicose Veins Mother    Breast cancer Neg Hx     ALLERGIES:  is allergic to aspirin, shrimp extract allergy skin test, and shrimp [shellfish allergy].  MEDICATIONS:  Current Outpatient Medications  Medication Sig Dispense Refill   tamoxifen (NOLVADEX) 20 MG tablet TAKE 1 TABLET  BY MOUTH EVERY DAY 30 tablet 0   citalopram (CELEXA) 20 MG tablet TAKE 1 TABLET BY MOUTH EVERY DAY (Patient not taking: Reported on 12/03/2021) 90 tablet 2   Semaglutide (RYBELSUS) 14 MG TABS Take 14 mg by mouth every morning. PT TAKES FOR WEIGHT LOSS ONLY (Patient not taking: Reported on 06/20/2022)     No current facility-administered medications for this visit.      Marland Kitchen  PHYSICAL EXAMINATION: ECOG PERFORMANCE STATUS: 0 - Asymptomatic  Vitals:   06/21/22 1300  BP: (!) 92/56  Pulse: 68  Resp: 16  Temp: 98.4 F (36.9 C)   Filed Weights   06/21/22 1300  Weight: 189 lb 6.4 oz (85.9 kg)    Physical Exam HENT:     Head: Normocephalic and atraumatic.     Mouth/Throat:     Pharynx: No oropharyngeal exudate.  Eyes:     Pupils: Pupils are equal, round, and reactive to light.  Cardiovascular:     Rate and Rhythm: Normal rate and regular rhythm.  Pulmonary:     Effort: Pulmonary effort is  normal. No respiratory distress.     Breath sounds: Normal breath sounds. No wheezing.  Abdominal:     General: Bowel sounds are normal. There is no distension.     Palpations: Abdomen is soft. There is no mass.     Tenderness: There is no abdominal tenderness. There is no guarding or rebound.  Musculoskeletal:        General: No tenderness. Normal range of motion.     Cervical back: Normal range of motion and neck supple.  Skin:    General: Skin is warm.  Neurological:     Mental Status: She is alert and oriented to person, place, and time.  Psychiatric:        Mood and Affect: Affect normal.      LABORATORY DATA:  I have reviewed the data as listed Lab Results  Component Value Date   WBC 4.3 05/21/2021   HGB 12.3 05/21/2021   HCT 37.4 05/21/2021   MCV 93.0 05/21/2021   PLT 226 05/21/2021   No results for input(s): "NA", "K", "CL", "CO2", "GLUCOSE", "BUN", "CREATININE", "CALCIUM", "GFRNONAA", "GFRAA", "PROT", "ALBUMIN", "AST", "ALT", "ALKPHOS", "BILITOT", "BILIDIR", "IBILI" in the  last 8760 hours.  RADIOGRAPHIC STUDIES: I have personally reviewed the radiological images as listed and agreed with the findings in the report. No results found.   ASSESSMENT & PLAN:   Carcinoma of upper-outer quadrant of right breast in female, estrogen receptor positive ([place]) #Breast cancer stage I ER/PR positive HER2 negative] postlumpectomy T1b; grade 1; no LVI] [low risk; NO oncotype; no chemo]. currently on Tamoxifen [started sep 1st, 2022]; [id number].  MARCH 2023-BIL mammogram- WNL [Dr.Byrnett].    Continue tamoxifen tolerating well except for weight gain.   # Weight gain- from tamoxifen [20 pounds in 6 months];  Discussed importance of healthy weight/and weight loss.  Strongly recommend eating more green leafy vegetables and cutting down processed food/ carbohydrates.  Instead increasing whole grains / protein in the diet.  Will refer the patient to weight loss clinic.  #History of depression- not taking Celexa 20 mg/day-insomnia-not any worse.  Stable.  # DISPOSITION: # referral to [facility] weight loss management program re: weight gain/obesity tamoxifen  # follow up in 6 months- MD--Dr.B  Addendum: Unfortunately, patient's insurance does not cover a referral to weight loss clinic.  All questions were answered. The patient/family knows to call the clinic with any problems, questions or concerns.    Cammie Sickle, MD 06/21/2022 7:41 PM

## 2022-07-02 ENCOUNTER — Other Ambulatory Visit: Payer: Self-pay | Admitting: Internal Medicine

## 2022-07-21 ENCOUNTER — Other Ambulatory Visit (INDEPENDENT_AMBULATORY_CARE_PROVIDER_SITE_OTHER): Payer: Self-pay | Admitting: Nurse Practitioner

## 2022-07-21 DIAGNOSIS — I83811 Varicose veins of right lower extremities with pain: Secondary | ICD-10-CM

## 2022-07-22 ENCOUNTER — Ambulatory Visit (INDEPENDENT_AMBULATORY_CARE_PROVIDER_SITE_OTHER): Payer: 59

## 2022-07-22 ENCOUNTER — Ambulatory Visit (INDEPENDENT_AMBULATORY_CARE_PROVIDER_SITE_OTHER): Payer: 59 | Admitting: Nurse Practitioner

## 2022-07-22 VITALS — BP 103/66 | HR 65 | Resp 16 | Wt 188.4 lb

## 2022-07-22 DIAGNOSIS — M79604 Pain in right leg: Secondary | ICD-10-CM

## 2022-07-22 DIAGNOSIS — I83811 Varicose veins of right lower extremities with pain: Secondary | ICD-10-CM

## 2022-07-22 MED ORDER — IBUPROFEN 800 MG PO TABS: 800.0000 mg | ORAL_TABLET | Freq: Three times a day (TID) | ORAL | 3 refills | Status: DC | PRN

## 2022-08-05 ENCOUNTER — Other Ambulatory Visit: Payer: Self-pay | Admitting: Internal Medicine

## 2022-08-11 ENCOUNTER — Encounter (INDEPENDENT_AMBULATORY_CARE_PROVIDER_SITE_OTHER): Payer: Self-pay | Admitting: Nurse Practitioner

## 2022-08-11 NOTE — Progress Notes (Signed)
Subjective:    Patient ID: Kelsey Chavez, female    DOB: 1966-07-11, 56 y.o.   MRN: [id number] Chief Complaint  Patient presents with   Follow-up    Ref Hande consult right varicose veins with pain     Kelsey Chavez is a 56 year old female who presents today for evaluation of pain in her right lower extremity.  The patient has previously had treatment of her bilateral great saphenous veins in 2018, using endovenous laser ablation.  She has had a couple of episodes of sclerotherapy.  The patient notes that recently she has been having more pain and discomfort in her right leg.  She notes some varicosities that are tender in her calf.  The pain that bothers her most is that she has pain that radiates from her hip down to her toes.  It is worse when she is active.  She also notes that she has significant pain after laying or sitting for a period of time.  Today noninvasive studies show that there is reflux in the superficial calf varicosities.  However there is no reflux noted in the great saphenous vein or small saphenous vein.  No evidence of deep venous insufficiency no evidence of DVT.  There is also noted Baker's cyst behind the right knee    Review of Systems  Musculoskeletal:  Positive for myalgias.  All other systems reviewed and are negative.      Objective:   Physical Exam Vitals reviewed.  HENT:     Head: Normocephalic.  Cardiovascular:     Rate and Rhythm: Normal rate.     Pulses: Normal pulses.  Pulmonary:     Effort: Pulmonary effort is normal.  Skin:    General: Skin is warm and dry.  Neurological:     Mental Status: She is alert and oriented to person, place, and time.  Psychiatric:        Mood and Affect: Mood normal.        Behavior: Behavior normal.        Thought Content: Thought content normal.        Judgment: Judgment normal.     BP 103/66 (BP Location: Right Arm)   Pulse 65   Resp 16   Wt 188 lb 6.4 oz (85.5 kg)   BMI 36.79 kg/m   Past  Medical History:  Diagnosis Date   Cancer ([place])    breast   Depression    Varicose veins of both lower extremities    Vertigo     Social History   Socioeconomic History   Marital status: Legally Separated    Spouse name: Not on file   Number of children: Not on file   Years of education: Not on file   Highest education level: Not on file  Occupational History   Not on file  Tobacco Use   Smoking status: Never   Smokeless tobacco: Never  Vaping Use   Vaping Use: Never used  Substance and Sexual Activity   Alcohol use: Yes    Alcohol/week: 4.0 standard drinks of alcohol    Types: 2 Glasses of wine, 2 Cans of beer per week    Comment: OCC   Drug use: No   Sexual activity: Not on file  Other Topics Concern   Not on file  Social History Narrative   Works at [employer]; lives in [place] with daughters; never smoked; no alcohol.   Social Determinants of Health   Financial Resource Strain: Not on file  Food Insecurity: Not on file  Transportation Needs: Not on file  Physical Activity: Not on file  Stress: Not on file  Social Connections: Not on file  Intimate Partner Violence: Not on file    Past Surgical History:  Procedure Laterality Date   BREAST BIOPSY Right 02/24/2021   u/s bx "   BREAST LUMPECTOMY     BREAST LUMPECTOMY WITH SENTINEL LYMPH NODE BIOPSY Right 03/24/2021   Procedure: BREAST LUMPECTOMY WITH SENTINEL LYMPH NODE BX;  Surgeon: Robert Bellow, MD;  Location: [facility] ORS;  Service: General;  Laterality: Right;   [place]     COLONOSCOPY WITH PROPOFOL N/A 05/13/2019   Procedure: COLONOSCOPY WITH PROPOFOL;  Surgeon: Manya Silvas, MD;  Location[facility] ENDOSCOPY;  Service: Endoscopy;  Laterality: N/A;   COLONOSCOPY WITH PROPOFOL N/A 09/25/2019   Procedure: COLONOSCOPY WITH PROPOFOL;  Surgeon: Toledo, Benay Pike, MD;  Location: [facility] ENDOSCOPY;  Service: Gastroenterology;  Laterality: N/A;   DIAGNOSTIC LAPAROSCOPY      TONSILLECTOMY      Family History  Problem Relation Age of Onset   Varicose Veins Mother    Breast cancer Neg Hx     Allergies  Allergen Reactions   Aspirin     Other reaction(s): Dizziness   Shrimp Extract Allergy Skin Test Itching   Shrimp [Shellfish Allergy] Itching       Latest Ref Rng & Units 05/21/2021    8:35 AM  CBC  WBC 4.0 - 10.5 K/uL 4.3   Hemoglobin 12.0 - 15.0 g/dL 12.3   Hematocrit 36.0 - 46.0 % 37.4   Platelets 150 - 400 K/uL 226       CMP  No results found for: "NA", "K", "CL", "CO2", "GLUCOSE", "BUN", "CREATININE", "CALCIUM", "PROT", "ALBUMIN", "AST", "ALT", "ALKPHOS", "BILITOT", "GFRNONAA", "GFRAA"   No results found.     Assessment & Plan:   1. Varicose veins of right lower extremity with pain Recommend:  The patient has had successful ablation of the previously incompetent saphenous venous system but still has persistent symptoms of pain and swelling that are having a negative impact on daily life and daily activities.  Patient should undergo injection sclerotherapy to treat the residual varicosities of the right lower extremity.  The risks, benefits and alternative therapies were reviewed in detail with the patient.  All questions were answered.  The patient agrees to proceed with sclerotherapy at their convenience.  The patient will continue wearing the graduated compression stockings and using the over-the-counter pain medications to treat her symptoms.      - VAS [place] LOWER EXTREMITY VENOUS REFLUX  2. Pain of right lower extremity The patient has positive straight leg raise test.  The pain the patient is describing in her hip and buttock area, is not related to varicosities but more so likely related to sciatica.  We will refer the patient to orthopedic surgery for further evaluation.  She also has a Baker's cyst that they may be able to offer guidance on as well. - Ambulatory referral to Orthopedic Surgery   Current Outpatient Medications  on File Prior to Visit  Medication Sig Dispense Refill   citalopram (CELEXA) 20 MG tablet TAKE 1 TABLET BY MOUTH EVERY DAY (Patient not taking: Reported on 12/03/2021) 90 tablet 2   Semaglutide (RYBELSUS) 14 MG TABS Take 14 mg by mouth every morning. PT TAKES FOR WEIGHT LOSS ONLY (Patient not taking: Reported on 06/20/2022)     No current facility-administered medications on  file prior to visit.    There are no Patient Instructions on file for this visit. No follow-ups on file.   Kris Hartmann, NP

## 2022-09-12 ENCOUNTER — Encounter (INDEPENDENT_AMBULATORY_CARE_PROVIDER_SITE_OTHER): Payer: Self-pay

## 2022-09-14 ENCOUNTER — Ambulatory Visit (INDEPENDENT_AMBULATORY_CARE_PROVIDER_SITE_OTHER): Payer: 59 | Admitting: Nurse Practitioner

## 2022-09-14 VITALS — BP 100/66 | HR 67 | Resp 16 | Ht 60.0 in | Wt 184.0 lb

## 2022-09-14 DIAGNOSIS — I83811 Varicose veins of right lower extremities with pain: Secondary | ICD-10-CM

## 2022-09-20 ENCOUNTER — Encounter (INDEPENDENT_AMBULATORY_CARE_PROVIDER_SITE_OTHER): Payer: Self-pay | Admitting: Nurse Practitioner

## 2022-09-20 NOTE — Progress Notes (Signed)
Varicose veins of right  lower extremity with inflammation (454.1  I83.10) Current Plans   Indication: Patient presents with symptomatic varicose veins of the right  lower extremity.   Procedure: Sclerotherapy using hypertonic saline mixed with 1% Lidocaine was performed on the right lower extremity. Compression wraps were placed. The patient tolerated the procedure well. 

## 2022-09-28 ENCOUNTER — Other Ambulatory Visit: Payer: Self-pay | Admitting: *Deleted

## 2022-09-28 MED ORDER — TAMOXIFEN CITRATE 20 MG PO TABS
20.0000 mg | ORAL_TABLET | Freq: Every day | ORAL | 2 refills | Status: DC
Start: 2022-09-28 — End: 2022-12-14

## 2022-10-19 ENCOUNTER — Ambulatory Visit (INDEPENDENT_AMBULATORY_CARE_PROVIDER_SITE_OTHER): Payer: 59 | Admitting: Nurse Practitioner

## 2022-10-19 ENCOUNTER — Encounter (INDEPENDENT_AMBULATORY_CARE_PROVIDER_SITE_OTHER): Payer: Self-pay | Admitting: Nurse Practitioner

## 2022-10-19 VITALS — BP 109/70 | HR 59 | Resp 17 | Ht 60.0 in | Wt 181.2 lb

## 2022-10-19 DIAGNOSIS — I83811 Varicose veins of right lower extremities with pain: Secondary | ICD-10-CM | POA: Diagnosis not present

## 2022-10-24 ENCOUNTER — Ambulatory Visit: Payer: 59 | Admitting: Dietician

## 2022-10-30 ENCOUNTER — Encounter (INDEPENDENT_AMBULATORY_CARE_PROVIDER_SITE_OTHER): Payer: Self-pay | Admitting: Nurse Practitioner

## 2022-10-30 NOTE — Progress Notes (Signed)
Varicose veins of right  lower extremity with inflammation (454.1  I83.10) Current Plans   Indication: Patient presents with symptomatic varicose veins of the right  lower extremity.   Procedure: Sclerotherapy using hypertonic saline mixed with 1% Lidocaine was performed on the right lower extremity. Compression wraps were placed. The patient tolerated the procedure well. 

## 2022-11-16 ENCOUNTER — Ambulatory Visit (INDEPENDENT_AMBULATORY_CARE_PROVIDER_SITE_OTHER): Payer: 59 | Admitting: Nurse Practitioner

## 2022-11-18 ENCOUNTER — Ambulatory Visit (INDEPENDENT_AMBULATORY_CARE_PROVIDER_SITE_OTHER): Payer: 59 | Admitting: Nurse Practitioner

## 2022-12-14 ENCOUNTER — Encounter: Payer: Self-pay | Admitting: Internal Medicine

## 2022-12-14 ENCOUNTER — Ambulatory Visit: Payer: 59 | Admitting: Dietician

## 2022-12-14 ENCOUNTER — Inpatient Hospital Stay: Payer: 59 | Attending: Internal Medicine | Admitting: Internal Medicine

## 2022-12-14 VITALS — BP 102/64 | HR 68 | Temp 97.3°F | Resp 18 | Wt 185.0 lb

## 2022-12-14 DIAGNOSIS — F32A Depression, unspecified: Secondary | ICD-10-CM | POA: Insufficient documentation

## 2022-12-14 DIAGNOSIS — Z79810 Long term (current) use of selective estrogen receptor modulators (SERMs): Secondary | ICD-10-CM | POA: Insufficient documentation

## 2022-12-14 DIAGNOSIS — C50411 Malignant neoplasm of upper-outer quadrant of right female breast: Secondary | ICD-10-CM | POA: Insufficient documentation

## 2022-12-14 DIAGNOSIS — Z170 Estrogen receptor positive status [ER+]: Secondary | ICD-10-CM | POA: Diagnosis not present

## 2022-12-14 MED ORDER — TAMOXIFEN CITRATE 20 MG PO TABS: 20.0000 mg | ORAL_TABLET | Freq: Every day | ORAL | 1 refills | Status: DC

## 2022-12-14 NOTE — Progress Notes (Signed)
[facility] [place] NOTE  Patient Care Team: Tracie Harrier, MD as PCP - General (Internal Medicine) Rico Junker, RN as Oncology Nurse Navigator Cammie Sickle, MD as Consulting Physician (Internal Medicine) Noreene Filbert, MD as Consulting Physician (Radiation Oncology) Bary Castilla Forest Gleason, MD as Consulting Physician (General Surgery)  CHIEF COMPLAINTS/PURPOSE OF CONSULTATION: Breast cancer  #  Oncology History Overview Note  # RIGHT BREAST CA- cT1b [94m]c N0; ER/PR- POSITIVE  [>90%]; her 2 NEG; GRADE-1 [Dr.Byrnett]; status postlumpectomy-grade 1; tumor size 7 mm; no LVI; clear margins-no Oncotype recommended.s/p RT.  # SEP 1st week, 2022-start tamoxifen [perimenopausal]; wean off Wellbutrin; OCT 2022- celexa                        # SURVIVORSHIP:   # GENETICS:   DIAGNOSIS:   STAGE:         ;  GOALS:  CURRENT/MOST RECENT THERAPY :       Carcinoma of upper-outer quadrant of right breast in female, estrogen receptor positive ([facility]  03/04/2021 Initial Diagnosis   Carcinoma of upper-outer quadrant of right breast in female, estrogen receptor positive ([facility]   03/04/2021 Cancer Staging   Staging form: Breast, AJCC 8th Edition - Clinical: Stage IA (cT1b, cN0, cM0, G1, ER+, PR+, HER2-) - Signed by BCammie Sickle MD on 03/04/2021 Histologic grading system: 3 grade system    Genetic Testing   Negative genetic testing. No pathogenic variants identified on the [facility] CustomNext+RNA panel. The report date is 09/27/2021.  The CustomNext-Cancer + RNAinsight panel  includes sequencing and/or deletion duplication testing of the following 47 genes: APC, ATM, AXIN2, BARD1, BMPR1A, BRCA1, BRCA2, BRIP1, CDH1, CDKN2A (p14ARF), CDKN2A (p16INK4a), CKD4, CHEK2, CTNNA1, DICER1, EPCAM (Deletion/duplication testing only), GREM1 (promoter region deletion/duplication testing only), KIT, MEN1, MLH1, MSH2, MSH3, MSH6, MUTYH, NBN, NF1, NHTL1, PALB2, PDGFRA, PMS2, POLD1,  POLE, PTEN, RAD50, RAD51C, RAD51D, SDHB, SDHC, SDHD, SMAD4, SMARCA4. STK11, TP53, TSC1, TSC2, and VHL.  The following genes were evaluated for sequence changes only: SDHA and HOXB13 c.251G>A variant only.    HISTORY OF PRESENTING ILLNESS: Alone.  Ambulating independently.  [name]89y.[id number].  female patient with recent history of stage I ER/PR positive HER2 negative breast cancer-currently tamoxifen is here for follow-up.  Doing well. Denies any breast discomfort. Taking tamoxifen, does have hot flashes in the day time. Patient denies any worsening hot flashes.  Energy is fairly good. Working full time.   Review of Systems  Constitutional:  Negative for chills, diaphoresis, fever, malaise/fatigue and weight loss.  HENT:  Negative for nosebleeds and sore throat.   Eyes:  Negative for double vision.  Respiratory:  Negative for cough, hemoptysis, sputum production, shortness of breath and wheezing.   Cardiovascular:  Negative for chest pain, palpitations, orthopnea and leg swelling.  Gastrointestinal:  Negative for abdominal pain, blood in stool, constipation, diarrhea, heartburn, melena, nausea and vomiting.  Genitourinary:  Negative for dysuria, frequency and urgency.  Musculoskeletal:  Negative for back pain and joint pain.  Skin: Negative.  Negative for itching and rash.  Neurological:  Negative for dizziness, tingling, focal weakness, weakness and headaches.  Endo/Heme/Allergies:  Does not bruise/bleed easily.  Psychiatric/Behavioral:  Negative for depression. The patient is not nervous/anxious and does not have insomnia.      MEDICAL HISTORY:  Past Medical History:  Diagnosis Date   Cancer ([facility]    breast   Depression    Varicose veins of both lower extremities  Vertigo     SURGICAL HISTORY: Past Surgical History:  Procedure Laterality Date   BREAST BIOPSY Right 02/24/2021   u/s bx "   BREAST LUMPECTOMY     BREAST LUMPECTOMY WITH SENTINEL LYMPH NODE BIOPSY Right  03/24/2021   Procedure: BREAST LUMPECTOMY WITH SENTINEL LYMPH NODE BX;  Surgeon: Robert Bellow, MD;  Location: [facility] ORS;  Service: General;  Laterality: Right;   [place]     COLONOSCOPY WITH PROPOFOL N/A 05/13/2019   Procedure: COLONOSCOPY WITH PROPOFOL;  Surgeon: Manya Silvas, MD;  Location: [facility] ENDOSCOPY;  Service: Endoscopy;  Laterality: N/A;   COLONOSCOPY WITH PROPOFOL N/A 09/25/2019   Procedure: COLONOSCOPY WITH PROPOFOL;  Surgeon: Toledo, Benay Pike, MD;  Location: [facility] ENDOSCOPY;  Service: Gastroenterology;  Laterality: N/A;   DIAGNOSTIC LAPAROSCOPY     TONSILLECTOMY      SOCIAL HISTORY: Social History   Socioeconomic History   Marital status: Legally Separated    Spouse name: Not on file   Number of children: Not on file   Years of education: Not on file   Highest education level: Not on file  Occupational History   Not on file  Tobacco Use   Smoking status: Never   Smokeless tobacco: Never  Vaping Use   Vaping Use: Never used  Substance and Sexual Activity   Alcohol use: Yes    Alcohol/week: 4.0 standard drinks of alcohol    Types: 2 Glasses of wine, 2 Cans of beer per week    Comment: OCC   Drug use: No   Sexual activity: Not on file  Other Topics Concern   Not on file  Social History Narrative   Works at [employer]; lives in [place] with daughters; never smoked; no alcohol.   Social Determinants of Health   Financial Resource Strain: Not on file  Food Insecurity: Not on file  Transportation Needs: Not on file  Physical Activity: Not on file  Stress: Not on file  Social Connections: Not on file  Intimate Partner Violence: Not on file    FAMILY HISTORY: Family History  Problem Relation Age of Onset   Varicose Veins Mother    Breast cancer Neg Hx     ALLERGIES:  is allergic to aspirin, shrimp extract allergy skin test, and shrimp [shellfish allergy].  MEDICATIONS:  Current Outpatient Medications   Medication Sig Dispense Refill   ibuprofen (ADVIL) 800 MG tablet Take 1 tablet (800 mg total) by mouth every 8 (eight) hours as needed. 30 tablet 3   Vitamin D, Ergocalciferol, (DRISDOL) 1.25 MG (50000 UNIT) CAPS capsule Take 50,000 Units by mouth once a week.     tamoxifen (NOLVADEX) 20 MG tablet Take 1 tablet (20 mg total) by mouth daily. 90 tablet 1   No current facility-administered medications for this visit.      Marland Kitchen  PHYSICAL EXAMINATION: ECOG PERFORMANCE STATUS: 0 - Asymptomatic  Vitals:   12/14/22 1302  BP: 102/64  Pulse: 68  Resp: 18  Temp: (!) 97.3 F (36.3 C)  SpO2: 98%   Filed Weights   12/14/22 1302  Weight: 185 lb (83.9 kg)    Physical Exam HENT:     Head: Normocephalic and atraumatic.     Mouth/Throat:     Pharynx: No oropharyngeal exudate.  Eyes:     Pupils: Pupils are equal, round, and reactive to light.  Cardiovascular:     Rate and Rhythm: Normal rate and regular rhythm.  Pulmonary:  Effort: Pulmonary effort is normal. No respiratory distress.     Breath sounds: Normal breath sounds. No wheezing.  Abdominal:     General: Bowel sounds are normal. There is no distension.     Palpations: Abdomen is soft. There is no mass.     Tenderness: There is no abdominal tenderness. There is no guarding or rebound.  Musculoskeletal:        General: No tenderness. Normal range of motion.     Cervical back: Normal range of motion and neck supple.  Skin:    General: Skin is warm.  Neurological:     Mental Status: She is alert and oriented to person, place, and time.  Psychiatric:        Mood and Affect: Affect normal.      LABORATORY DATA:  I have reviewed the data as listed Lab Results  Component Value Date   WBC 4.3 05/21/2021   HGB 12.3 05/21/2021   HCT 37.4 05/21/2021   MCV 93.0 05/21/2021   PLT 226 05/21/2021   No results for input(s): "NA", "K", "CL", "CO2", "GLUCOSE", "BUN", "CREATININE", "CALCIUM", "GFRNONAA", "GFRAA", "PROT", "ALBUMIN",  "AST", "ALT", "ALKPHOS", "BILITOT", "BILIDIR", "IBILI" in the last 8760 hours.  RADIOGRAPHIC STUDIES: I have personally reviewed the radiological images as listed and agreed with the findings in the report. No results found.   ASSESSMENT & PLAN:   Carcinoma of upper-outer quadrant of right breast in female, estrogen receptor positive ([place]) #Breast cancer stage I ER/PR positive HER2 negative] postlumpectomy T1b; grade 1; no LVI] [low risk; NO oncotype; no chemo]. currently on Tamoxifen [started sep 1st, 2022]; [id number].  MARCH 2023-BIL mammogram- WNL [Dr.Byrnett].    Continue tamoxifen tolerating well except for weight gain.   #I discussed option of switching over to aromatase inhibitor plus Lupron vs. continued tamoxifen.  Patient not interested in changing any therapy at this time.  # Weight gain- from tamoxifen [20 pounds in 6 months];  Discussed importance of healthy weight/and weight loss.  Strongly recommend eating more green leafy vegetables and cutting down processed food/ carbohydrates.  Instead increasing whole grains / protein in the diet.  Will refer the patient to weight loss clinic.  #History of depression- not taking Celexa 20 mg/day-insomnia-not any worse.  Stable.  # DISPOSITION: # referral to [place] weight loss management program re: weight gain/obesity tamoxifen  # follow up in 6 months- MD--Dr.B  Addendum: Unfortunately, patient's insurance does not cover a referral to weight loss clinic.  All questions were answered. The patient/family knows to call the clinic with any problems, questions or concerns.    Cammie Sickle, MD 12/14/2022 1:35 PM

## 2022-12-14 NOTE — Progress Notes (Signed)
Doing well. Denies any breast discomfort. Taking tamoxifen, does have hot flashes in the day time. Energy is fairly good. Working full time.

## 2022-12-14 NOTE — Assessment & Plan Note (Addendum)
#  Breast cancer stage I ER/PR positive HER2 negative] postlumpectomy T1b; grade 1; no LVI] [low risk; NO oncotype; no chemo]. currently on Tamoxifen [started sep 1st, 2022]; [id number].  MARCH 2023-BIL mammogram- WNL [Dr.Byrnett].      # Continue tamoxifen tolerating well except for mild hot flashes. Refilled today.   #History of depression- not taking Celexa 20 mg/day-insomnia-not any worse.  Stable.  # DISPOSITION: # Mammogram March 2024- BIL # follow up in 6 months- MD; labs- cbc/cmp--Dr.B

## 2022-12-21 ENCOUNTER — Ambulatory Visit: Payer: 59 | Admitting: Internal Medicine

## 2023-02-01 IMAGING — US US BREAST*R* LIMITED INC AXILLA
1 series · 11 of 11 positions shown · non-contrast
Comparison: Previous exam(s).

CLINICAL DATA: Screening recall for a right breast asymmetry

EXAM:
DIGITAL DIAGNOSTIC UNILATERAL RIGHT MAMMOGRAM WITH TOMOSYNTHESIS AND
CAD; ULTRASOUND RIGHT BREAST LIMITED
TECHNIQUE: Right digital diagnostic mammography and breast tomosynthesis was
performed. The images were evaluated with computer-aided detection.;
Targeted ultrasound examination of the right breast was performed

[Series 1: us breast*right* limited inc axilla · 0.06mm/px · 11 of 11 slices shown]
[im 1/11]
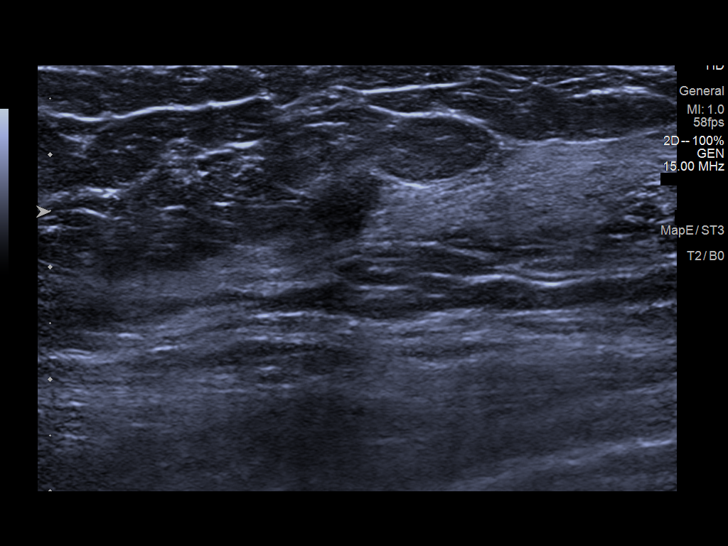
[im 2/11]
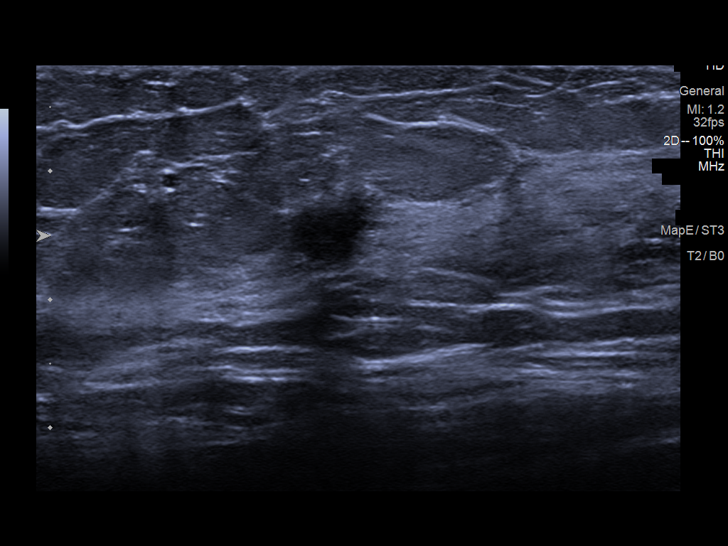
[im 3/11]
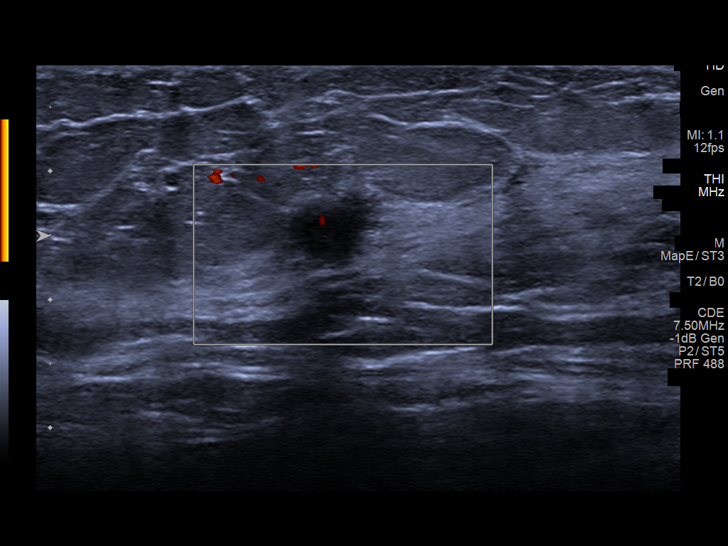
[im 4/11]
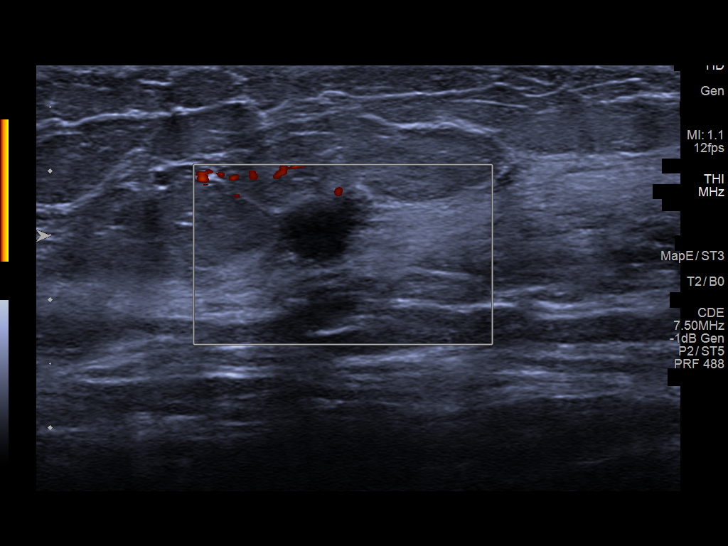
[im 5/11]
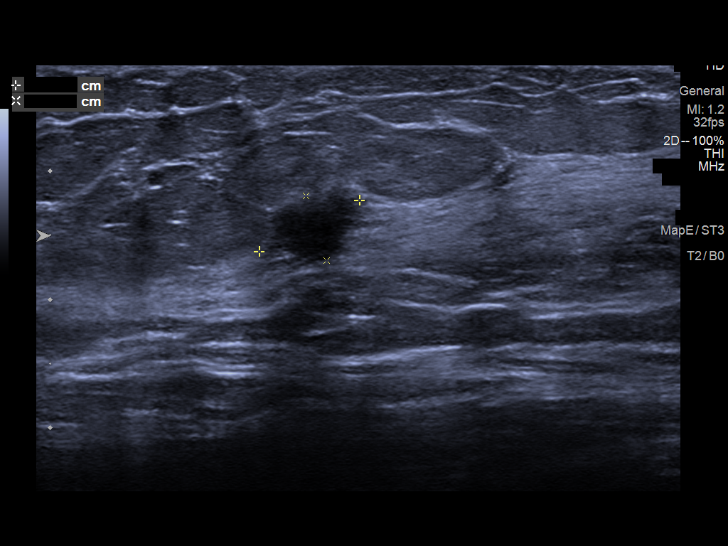
[im 6/11]
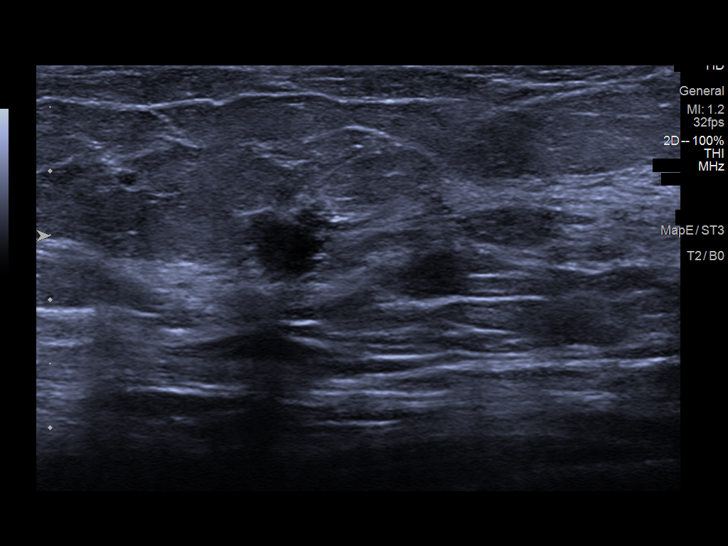
[im 7/11]
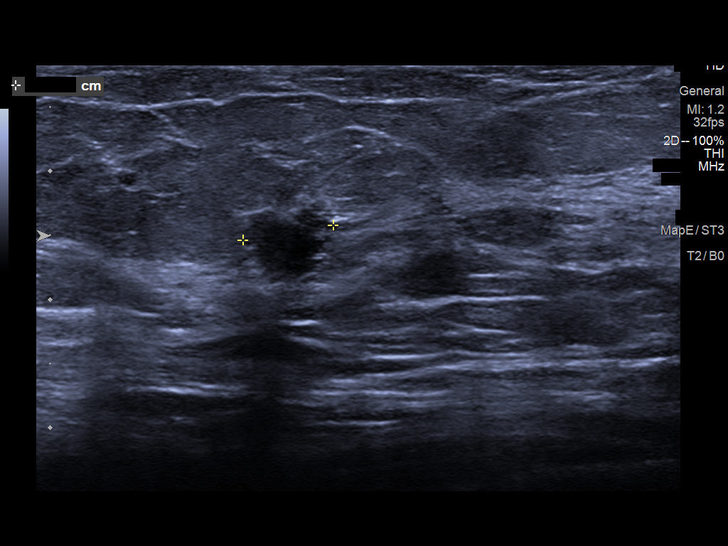
[im 8/11]
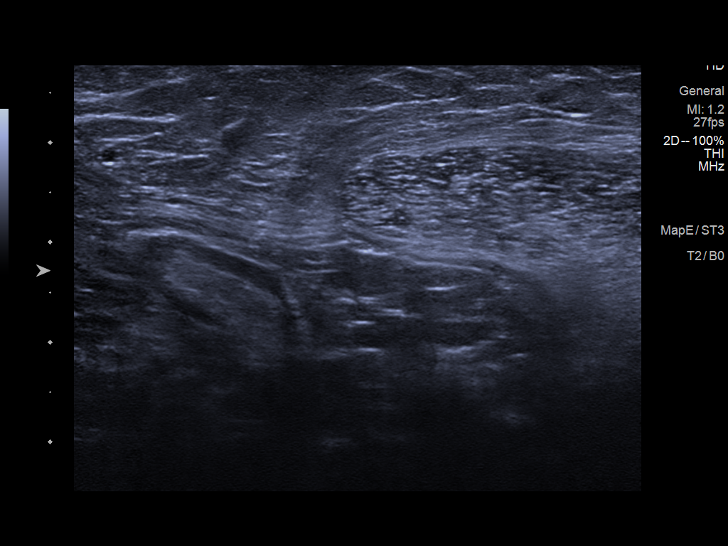
[im 9/11]
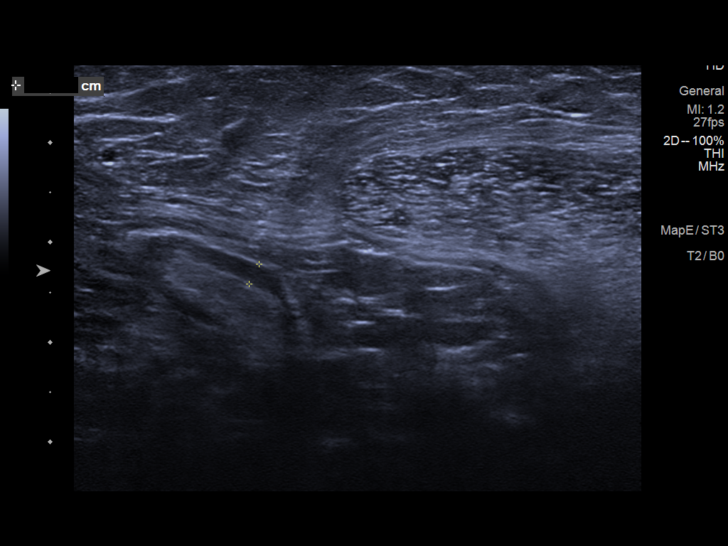
[im 10/11]
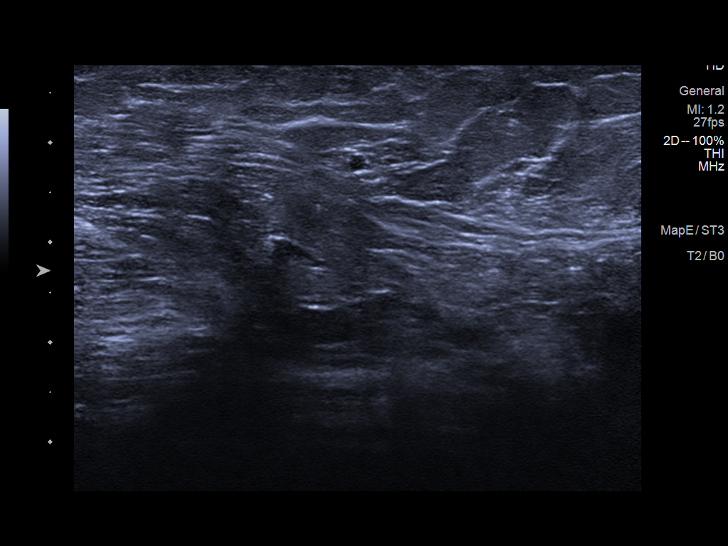
[im 11/11]
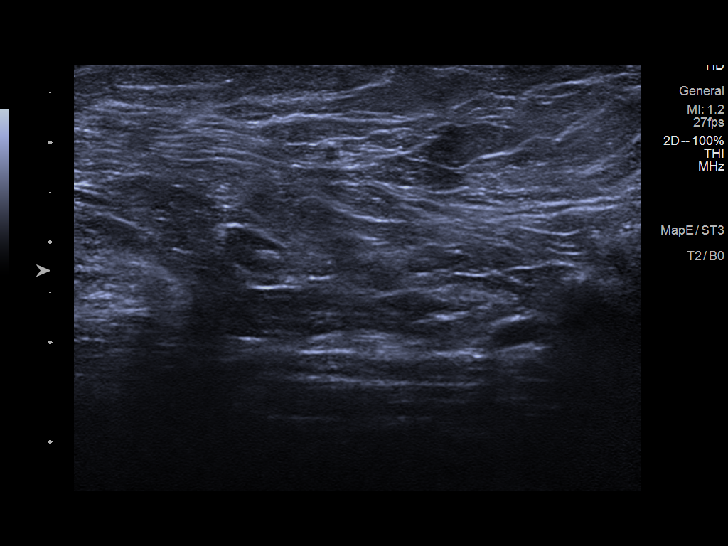

[11 of 11 positions shown; findings below may reference images not displayed]

ACR Breast Density Category b: There are scattered areas of
fibroglandular density.
FINDINGS: Spot compression tomosynthesis images through the lateral to
upper-outer right breast demonstrates a persistent focal asymmetry
measuring approximately 7 mm.

Ultrasound targeted to the right breast at 10 o'clock, 4 cm from the
nipple demonstrates an irregular hypoechoic vascular mass with
indistinct margins measuring 9 x 5 x 7 mm. Ultrasound of the right
axilla demonstrates multiple normal-appearing lymph nodes.
IMPRESSION: 1. There is a suspicious 9 mm mass in the right breast at 10
o'clock.

2.  No evidence of right axillary lymphadenopathy.

RECOMMENDATION:
Ultrasound guided biopsy is recommended for the right breast mass.
Close attention on the post biopsy mammogram is recommended to
ensure that the mass corresponds with the asymmetry. The patient
will be contacted by our scheduler to schedule the biopsy at her
earliest convenience.

I have discussed the findings and recommendations with the patient.
If applicable, a reminder letter will be sent to the patient
regarding the next appointment.

BI-RADS CATEGORY  5: Highly suggestive of malignancy.

## 2023-02-13 IMAGING — MG MM BREAST LOCALIZATION CLIP
4 series · 4 of 12 positions shown · non-contrast
Comparison: Previous exam(s).

CLINICAL DATA: Status post ultrasound-guided core biopsy of a right
breast mass.

EXAM:
3D DIAGNOSTIC RIGHT MAMMOGRAM POST ULTRASOUND BIOPSY

[R CC synth-2D]
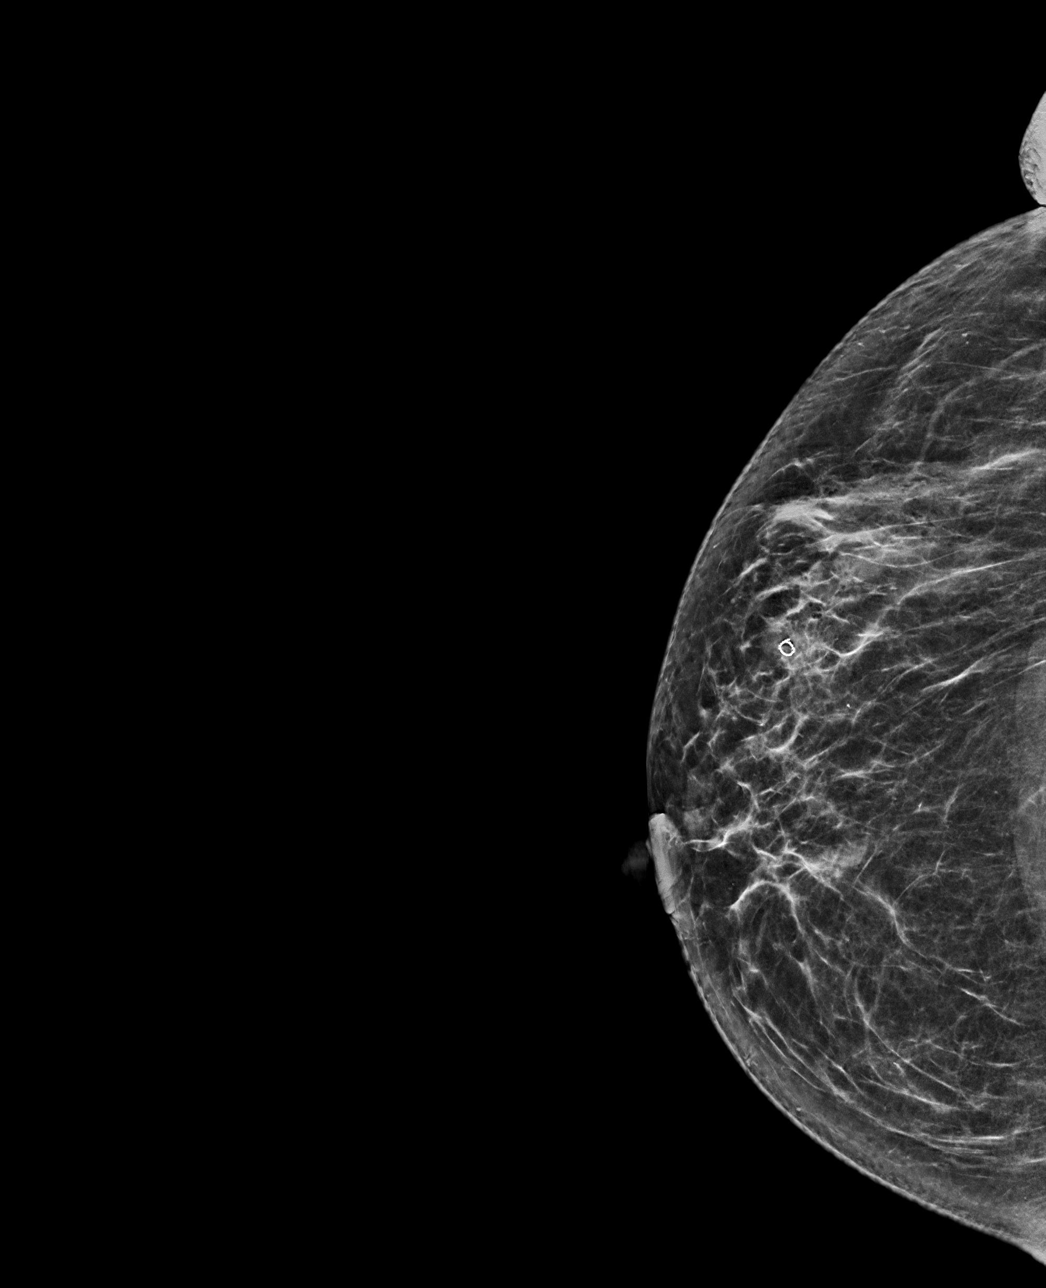

[R ML synth-2D]
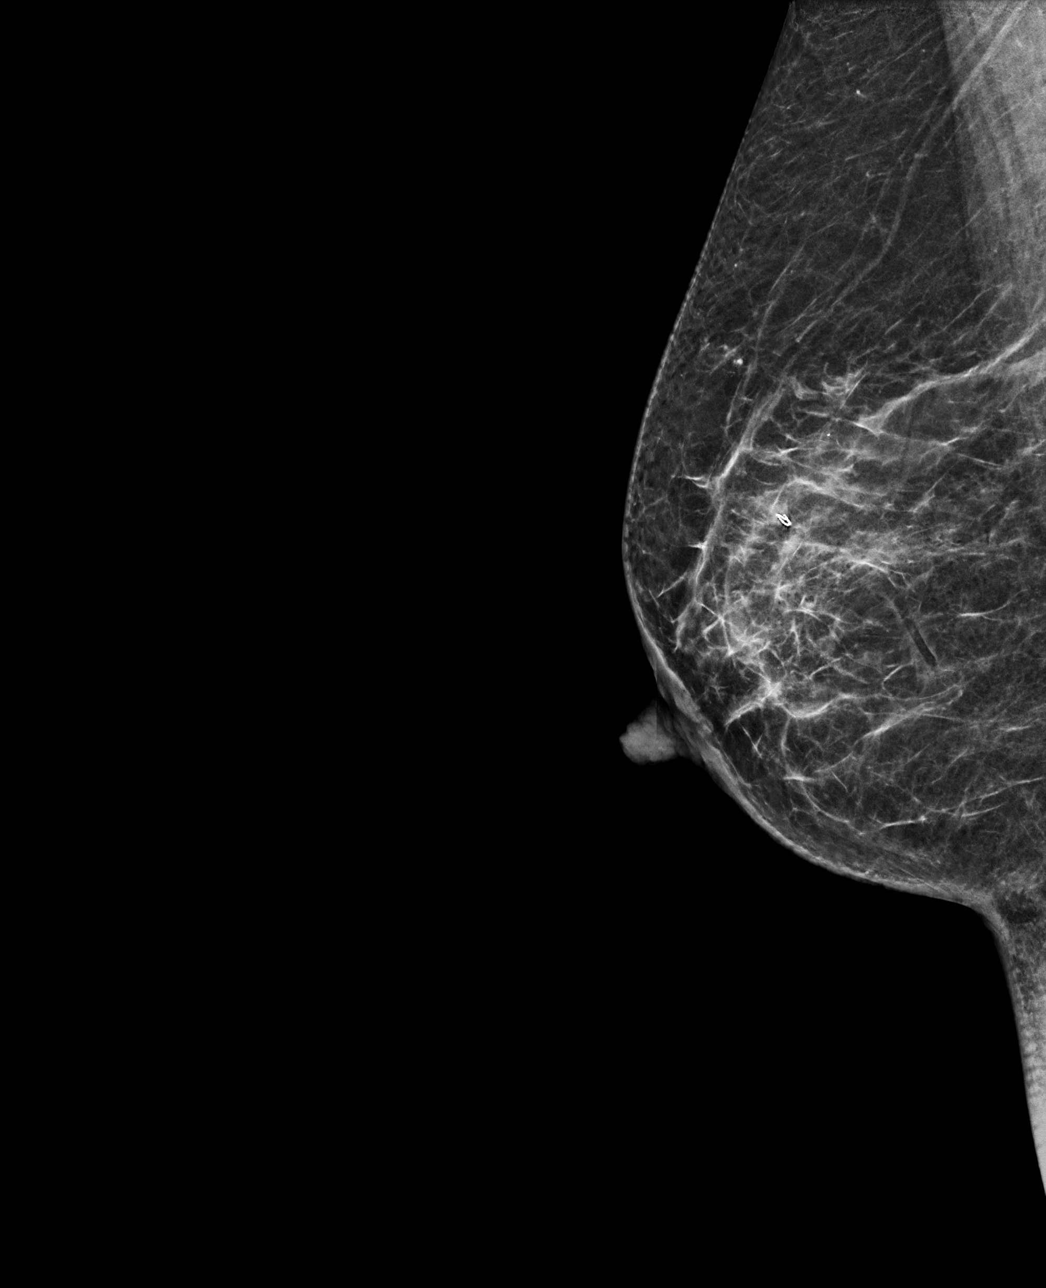

[R ML tomo · tomo slice 31/60.0]
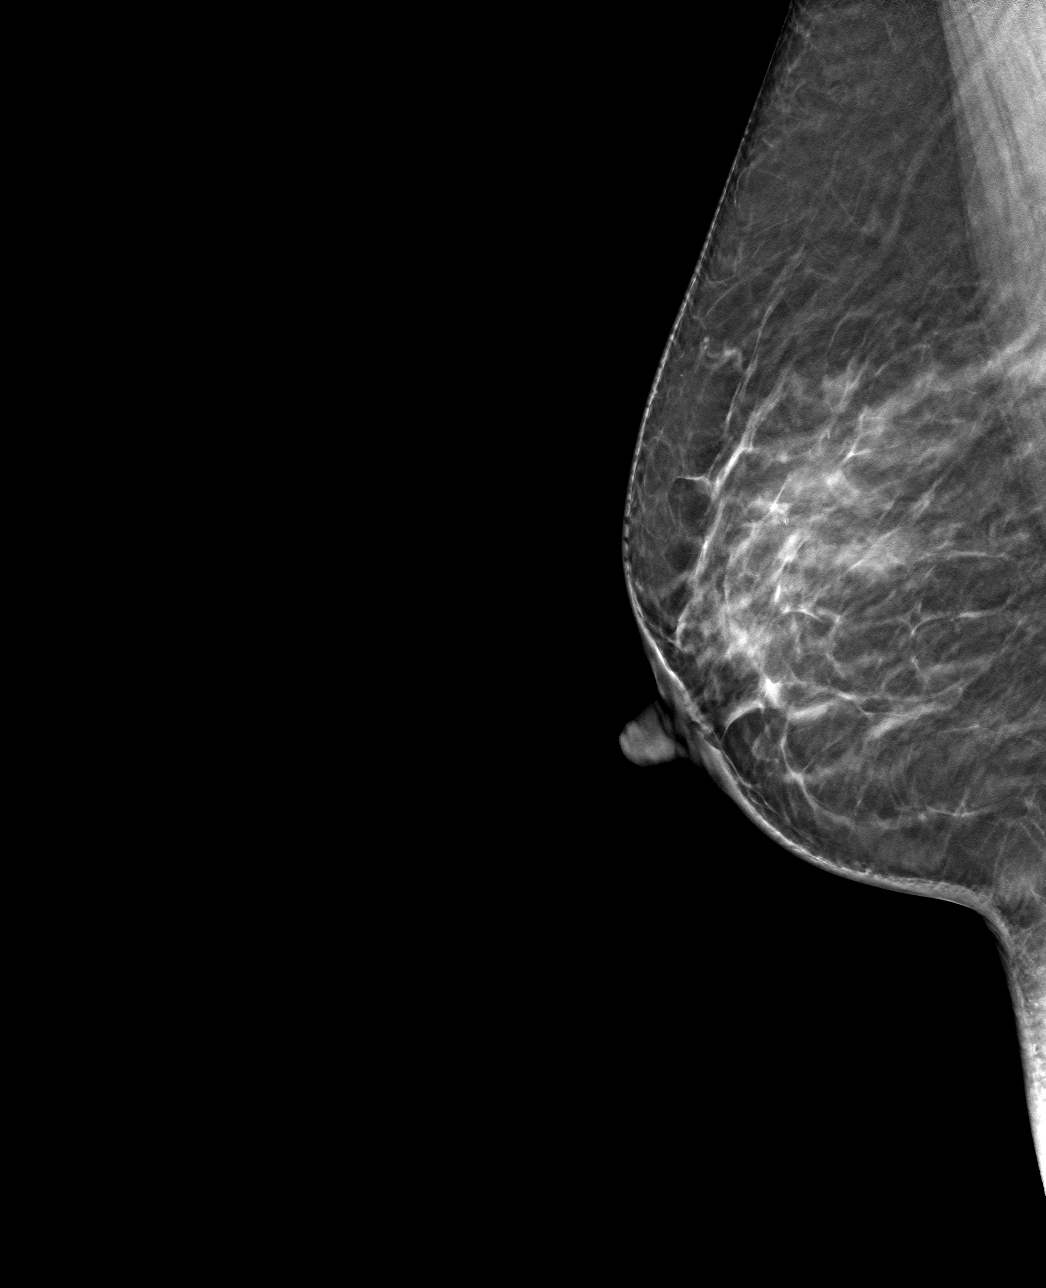

[R CC tomo · tomo slice 33/64.0]
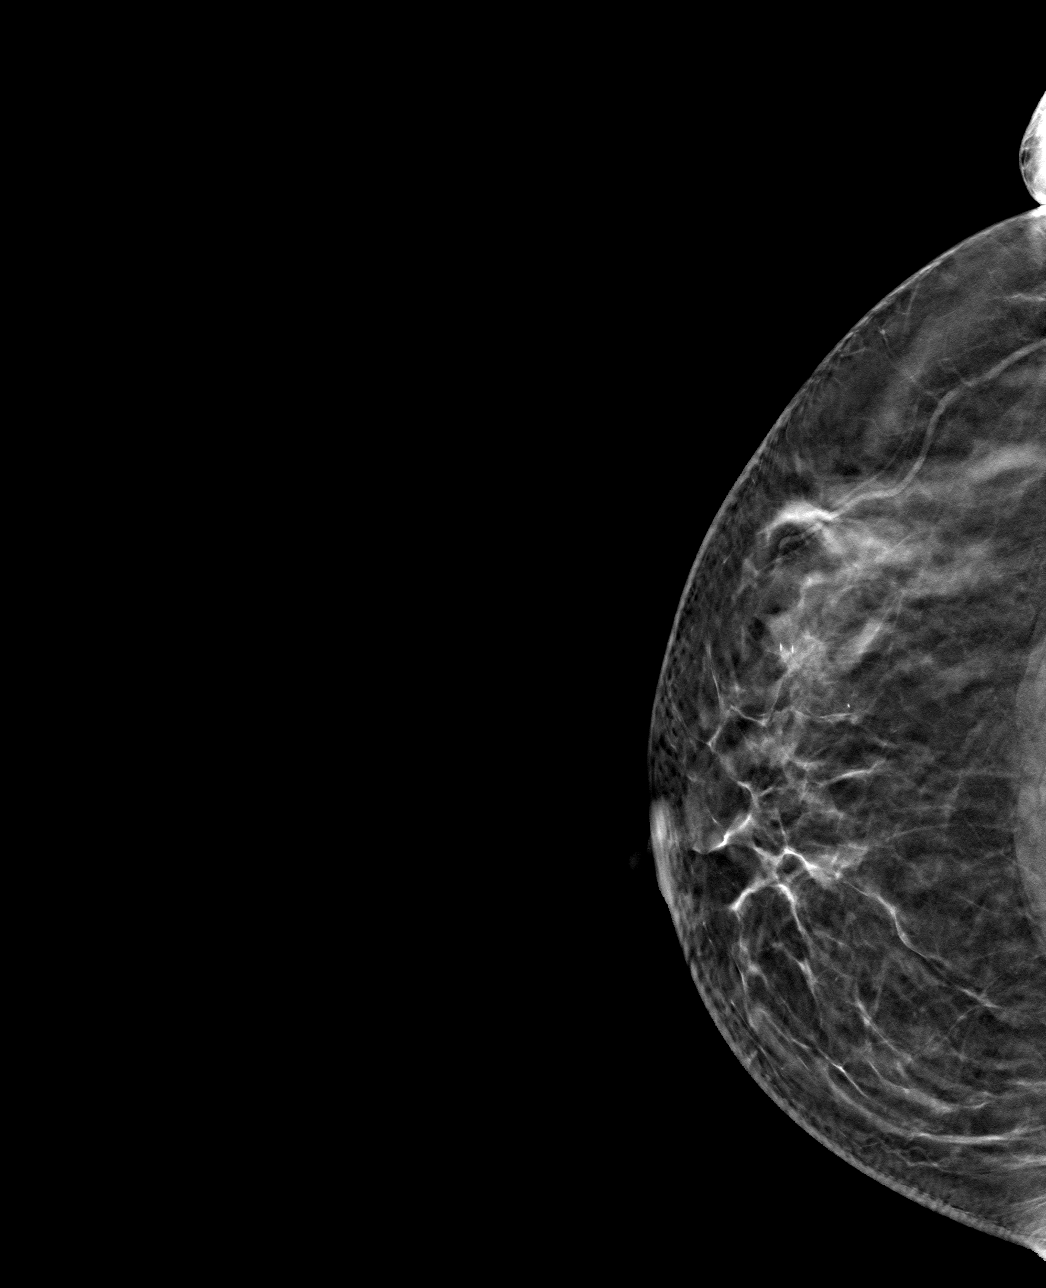

[4 of 12 positions shown; findings below may reference images not displayed]

FINDINGS: 3D Mammographic images were obtained following ultrasound guided
biopsy of the right. The biopsy marking clip is in the upper-outer
quadrant of the right breast.
IMPRESSION: Appropriate positioning of the Q shaped biopsy marking clip at the
site of biopsy in the upper-outer quadrant of the right breast.

Final Assessment: Post Procedure Mammograms for Marker Placement

## 2023-02-15 ENCOUNTER — Ambulatory Visit
Admission: RE | Admit: 2023-02-15 | Discharge: 2023-02-15 | Disposition: A | Discharge status: Home / Self Care | Payer: 59 | Source: Ambulatory Visit | Attending: Internal Medicine | Admitting: Internal Medicine

## 2023-02-15 DIAGNOSIS — Z170 Estrogen receptor positive status [ER+]: Secondary | ICD-10-CM | POA: Diagnosis present

## 2023-02-15 DIAGNOSIS — C50411 Malignant neoplasm of upper-outer quadrant of right female breast: Secondary | ICD-10-CM | POA: Insufficient documentation

## 2023-03-13 IMAGING — MG MM BREAST SURGICAL SPECIMEN
1 series · 1 of 1 positions shown · non-contrast
Comparison: Previous exam(s).

CLINICAL DATA: Surgical specimen following RIGHT lumpectomy for
breast cancer.

EXAM:
SPECIMEN RADIOGRAPH OF THE RIGHT BREAST

[R]
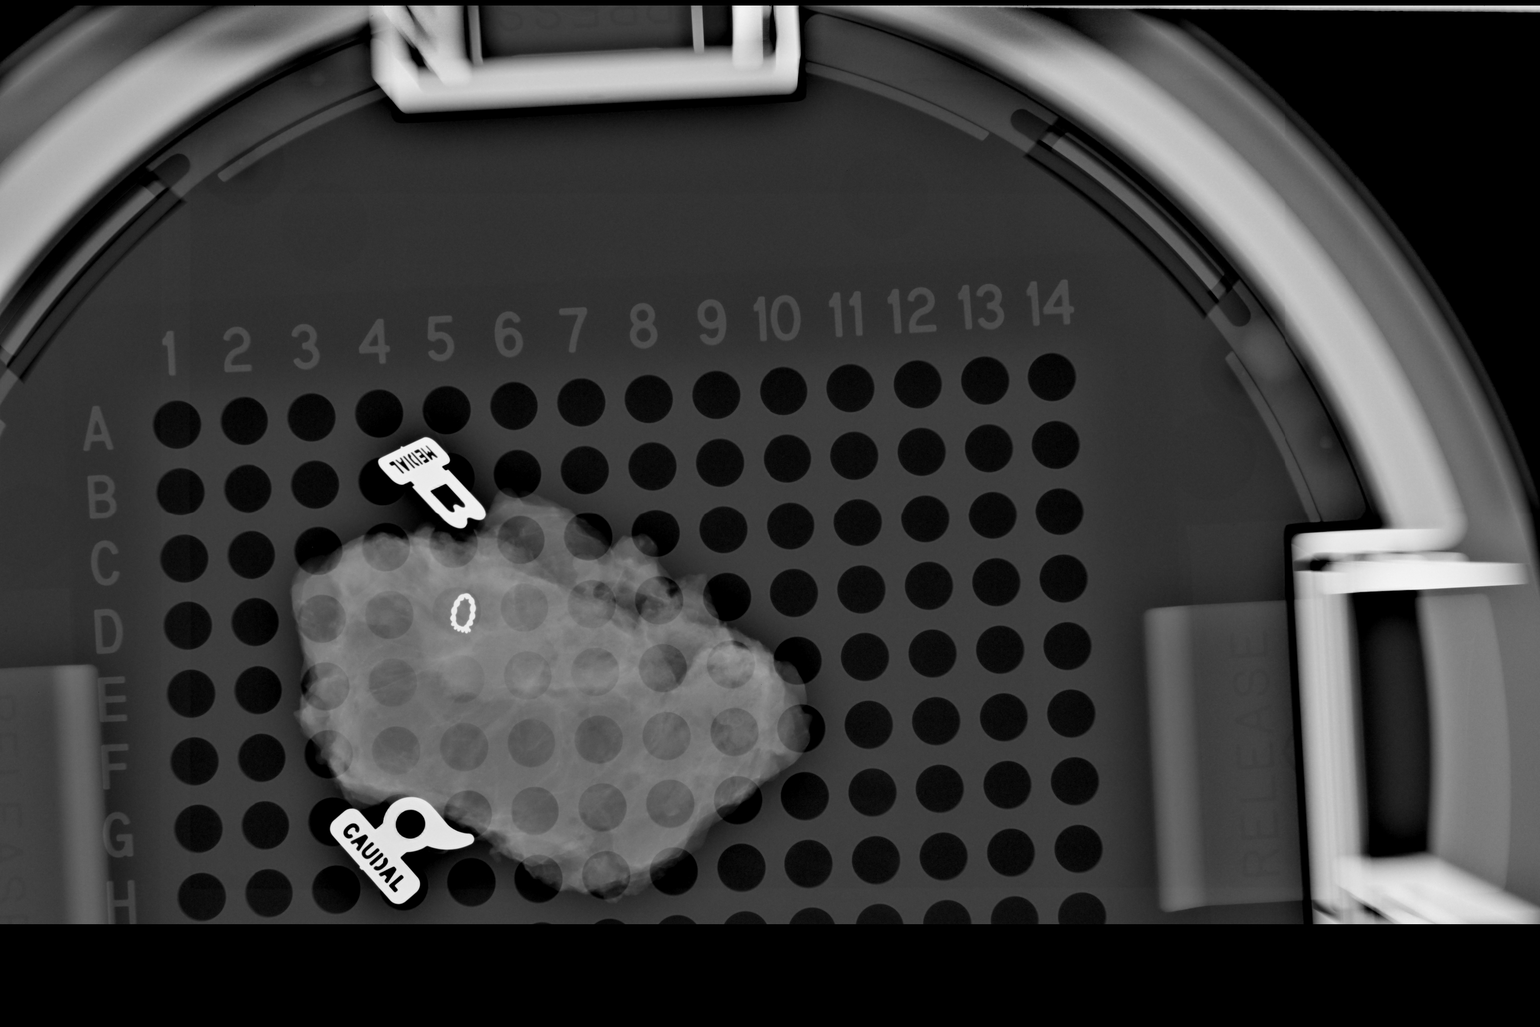

[1 of 1 positions shown; findings below may reference images not displayed]

FINDINGS: Specimen is submitted postoperatively for interpretation.

Status post excision of the RIGHT breast. The Q shaped clip is
present within the specimen.
IMPRESSION: Specimen radiograph of the RIGHT breast.

## 2023-05-24 ENCOUNTER — Other Ambulatory Visit: Payer: Self-pay | Admitting: Sports Medicine

## 2023-05-24 DIAGNOSIS — M25551 Pain in right hip: Secondary | ICD-10-CM

## 2023-05-24 DIAGNOSIS — M1611 Unilateral primary osteoarthritis, right hip: Secondary | ICD-10-CM

## 2023-06-06 ENCOUNTER — Encounter: Payer: Self-pay | Admitting: Sports Medicine

## 2023-06-08 ENCOUNTER — Telehealth: Payer: Self-pay | Admitting: *Deleted

## 2023-06-08 ENCOUNTER — Ambulatory Visit
Admission: RE | Admit: 2023-06-08 | Discharge: 2023-06-08 | Disposition: A | Discharge status: Home / Self Care | Payer: 59 | Source: Ambulatory Visit | Attending: Sports Medicine | Admitting: Sports Medicine

## 2023-06-08 DIAGNOSIS — M1611 Unilateral primary osteoarthritis, right hip: Secondary | ICD-10-CM

## 2023-06-08 DIAGNOSIS — M25551 Pain in right hip: Secondary | ICD-10-CM

## 2023-06-08 NOTE — Telephone Encounter (Addendum)
Patient called reporting that she is having right hip pain that started several months ago and has gradually gotten worse. She has seen 2 different doctors for this and has been through Physical Therapy and the pain is worse, not any better. She is scheduled for an MRI of right hip today and has a follow up appointment with Dr B 7/31. Daughter is concerned that she has cancer in that hip with the pain radiating into her thigh. I told her that the MRI is going to be very instrumental in telling what is going on in her hip and that the report will go to Dr Landry Mellow.

## 2023-06-14 ENCOUNTER — Ambulatory Visit: Payer: 59 | Admitting: Internal Medicine

## 2023-06-14 ENCOUNTER — Encounter: Payer: Self-pay | Admitting: Internal Medicine

## 2023-06-14 ENCOUNTER — Inpatient Hospital Stay: Payer: 59 | Attending: Internal Medicine

## 2023-06-14 DIAGNOSIS — C50411 Malignant neoplasm of upper-outer quadrant of right female breast: Secondary | ICD-10-CM | POA: Insufficient documentation

## 2023-06-14 DIAGNOSIS — N951 Menopausal and female climacteric states: Secondary | ICD-10-CM | POA: Diagnosis not present

## 2023-06-14 DIAGNOSIS — Z170 Estrogen receptor positive status [ER+]: Secondary | ICD-10-CM | POA: Insufficient documentation

## 2023-06-14 DIAGNOSIS — M160 Bilateral primary osteoarthritis of hip: Secondary | ICD-10-CM | POA: Diagnosis not present

## 2023-06-14 DIAGNOSIS — Z79810 Long term (current) use of selective estrogen receptor modulators (SERMs): Secondary | ICD-10-CM | POA: Insufficient documentation

## 2023-06-14 DIAGNOSIS — K760 Fatty (change of) liver, not elsewhere classified: Secondary | ICD-10-CM | POA: Diagnosis not present

## 2023-06-14 DIAGNOSIS — R232 Flushing: Secondary | ICD-10-CM | POA: Insufficient documentation

## 2023-06-14 LAB — CBC WITH DIFFERENTIAL/PLATELET
Abs Immature Granulocytes: 0.01 10*3/uL (ref 0.00–0.07)
Basophils Absolute: 0 10*3/uL (ref 0.0–0.1)
Basophils Relative: 1 %
Eosinophils Absolute: 0.2 10*3/uL (ref 0.0–0.5)
Eosinophils Relative: 3 %
HCT: 38 % (ref 36.0–46.0)
Hemoglobin: 12.5 g/dL (ref 12.0–15.0)
Immature Granulocytes: 0 %
Lymphocytes Relative: 31 %
Lymphs Abs: 1.9 10*3/uL (ref 0.7–4.0)
MCH: 30.8 pg (ref 26.0–34.0)
MCHC: 32.9 g/dL (ref 30.0–36.0)
MCV: 93.6 fL (ref 80.0–100.0)
Monocytes Absolute: 0.4 10*3/uL (ref 0.1–1.0)
Monocytes Relative: 7 %
Neutro Abs: 3.6 10*3/uL (ref 1.7–7.7)
Neutrophils Relative %: 58 %
Platelets: 229 10*3/uL (ref 150–400)
RBC: 4.06 MIL/uL (ref 3.87–5.11)
RDW: 12.2 % (ref 11.5–15.5)
WBC: 6.2 10*3/uL (ref 4.0–10.5)
nRBC: 0 % (ref 0.0–0.2)

## 2023-06-14 LAB — COMPREHENSIVE METABOLIC PANEL
ALT: 87 U/L — ABNORMAL HIGH (ref 0–44)
AST: 75 U/L — ABNORMAL HIGH (ref 15–41)
Albumin: 3.9 g/dL (ref 3.5–5.0)
Alkaline Phosphatase: 117 U/L (ref 38–126)
Anion gap: 7 (ref 5–15)
BUN: 17 mg/dL (ref 6–20)
CO2: 23 mmol/L (ref 22–32)
Calcium: 8.9 mg/dL (ref 8.9–10.3)
Chloride: 106 mmol/L (ref 98–111)
Creatinine, Ser: 0.56 mg/dL (ref 0.44–1.00)
GFR, Estimated: >60 mL/min (ref >60)
Glucose, Bld: 97 mg/dL (ref 70–99)
Potassium: 4.1 mmol/L (ref 3.5–5.1)
Sodium: 136 mmol/L (ref 135–145)
Total Bilirubin: 0.4 mg/dL (ref 0.3–1.2)
Total Protein: 7.6 g/dL (ref 6.5–8.1)

## 2023-06-14 NOTE — Assessment & Plan Note (Addendum)
#  Breast cancer stage I ER/PR positive HER2 negative] postlumpectomy T1b; grade 1; no LVI] [low risk; NO oncotype; no chemo]. currently on Tamoxifen [started sep 1st, 2022]; [id number].  APRIL 2024-BIL mammogram- WNL [Dr.Byrnett].    # No evidence of any recurrence.  Continue tamoxifen tolerating well except for mild hot flashes.   # # right hip pain- MRI July 2024- Severe osteoarthritis of the right hip. Large right hip joint effusion. Right labral degeneration with a superior labral tear. Awaiting ortho eval- [facility].  Unlikely is causing hip pain.  # fatty liver- mild elevation of AST/ALT- recommend cutting down carbs.   #History of depression- not taking Celexa 20 mg/day-insomnia-not any worse.  Stable.  # DISPOSITION: # follow up in 6 months- MD; labs- cbc/cmp; vit D 25 levels--Dr.B

## 2023-06-14 NOTE — Progress Notes (Signed)
MRI results

## 2023-06-14 NOTE — Progress Notes (Signed)
[facility] CONSULT NOTE  Patient Care Team: Barbette Reichmann, MD as PCP - General (Internal Medicine) Jim Like, RN as Oncology Nurse Navigator Earna Coder, MD as Consulting Physician (Internal Medicine) Carmina Miller, MD as Consulting Physician (Radiation Oncology) Lemar Livings Merrily Pew, MD as Consulting Physician (General Surgery)  CHIEF COMPLAINTS/PURPOSE OF CONSULTATION: Breast cancer  #  Oncology History Overview Note  # RIGHT BREAST CA- cT1b [83mm]c N0; ER/PR- POSITIVE  [>90%]; her 2 NEG; GRADE-1 [Dr.Byrnett]; status postlumpectomy-grade 1; tumor size 7 mm; no LVI; clear margins-no Oncotype recommended.s/p RT.  # SEP 1st week, 2022-start tamoxifen [perimenopausal]; wean off Wellbutrin; OCT 2022- celexa                        # SURVIVORSHIP:   # GENETICS:   DIAGNOSIS:   STAGE:         ;  GOALS:  CURRENT/MOST RECENT THERAPY :       Carcinoma of upper-outer quadrant of right breast in female, estrogen receptor positive (HCC)  03/04/2021 Initial Diagnosis   Carcinoma of upper-outer quadrant of right breast in female, estrogen receptor positive (HCC)   03/04/2021 Cancer Staging   Staging form: Breast, AJCC 8th Edition - Clinical: Stage IA (cT1b, cN0, cM0, G1, ER+, PR+, HER2-) - Signed by Earna Coder, MD on 03/04/2021 Histologic grading system: 3 grade system    Genetic Testing   Negative genetic testing. No pathogenic variants identified on the [employer] CustomNext+RNA panel. The report date is 09/27/2021.  The CustomNext-Cancer + RNAinsight panel  includes sequencing and/or deletion duplication testing of the following 47 genes: APC, ATM, AXIN2, BARD1, BMPR1A, BRCA1, BRCA2, BRIP1, CDH1, CDKN2A (p14ARF), CDKN2A (p16INK4a), CKD4, CHEK2, CTNNA1, DICER1, EPCAM (Deletion/duplication testing only), GREM1 (promoter region deletion/duplication testing only), KIT, MEN1, MLH1, MSH2, MSH3, MSH6, MUTYH, NBN, NF1, NHTL1, PALB2, PDGFRA, PMS2, POLD1,  POLE, PTEN, RAD50, RAD51C, RAD51D, SDHB, SDHC, SDHD, SMAD4, SMARCA4. STK11, TP53, TSC1, TSC2, and VHL.  The following genes were evaluated for sequence changes only: SDHA and HOXB13 c.251G>A variant only.    HISTORY OF PRESENTING ILLNESS: with daughter.  Ambulating independently.  Jerral Ralph 57 y.o.  female patient with recent history of stage I ER/PR positive HER2 negative breast cancer-currently tamoxifen is here for follow-up.  Patient complians of worsening of right pain in last 3-4 months. Seen by ortho recently.   Doing well. Denies any breast discomfort. Taking tamoxifen, does have hot flashes in the day time. Patient denies any worsening hot flashes.  Energy is fairly good. Working full time.   Review of Systems  Constitutional:  Negative for chills, diaphoresis, fever, malaise/fatigue and weight loss.  HENT:  Negative for nosebleeds and sore throat.   Eyes:  Negative for double vision.  Respiratory:  Negative for cough, hemoptysis, sputum production, shortness of breath and wheezing.   Cardiovascular:  Negative for chest pain, palpitations, orthopnea and leg swelling.  Gastrointestinal:  Negative for abdominal pain, blood in stool, constipation, diarrhea, heartburn, melena, nausea and vomiting.  Genitourinary:  Negative for dysuria, frequency and urgency.  Musculoskeletal:  Negative for back pain and joint pain.  Skin: Negative.  Negative for itching and rash.  Neurological:  Negative for dizziness, tingling, focal weakness, weakness and headaches.  Endo/Heme/Allergies:  Does not bruise/bleed easily.  Psychiatric/Behavioral:  Negative for depression. The patient is not nervous/anxious and does not have insomnia.      MEDICAL HISTORY:  Past Medical History:  Diagnosis Date   Cancer (  HCC)    breast   Depression    Varicose veins of both lower extremities    Vertigo     SURGICAL HISTORY: Past Surgical History:  Procedure Laterality Date   BREAST BIOPSY Right  02/24/2021   u/s bx "   BREAST LUMPECTOMY     BREAST LUMPECTOMY WITH SENTINEL LYMPH NODE BIOPSY Right 03/24/2021   Procedure: BREAST LUMPECTOMY WITH SENTINEL LYMPH NODE BX;  Surgeon: Earline Mayotte, MD;  Location: [facility] ORS;  Service: General;  Laterality: Right;   CESAREAN SECTION  1996   CHOLECYSTECTOMY     COLONOSCOPY WITH PROPOFOL N/A 05/13/2019   Procedure: COLONOSCOPY WITH PROPOFOL;  Surgeon: Scot Jun, MD;  Location[facility];  Service: Endoscopy;  Laterality: N/A;   COLONOSCOPY WITH PROPOFOL N/A 09/25/2019   Procedure: COLONOSCOPY WITH PROPOFOL;  Surgeon: Toledo, Boykin Nearing, MD;  Location: [facility] ENDOSCOPY;  Service: Gastroenterology;  Laterality: N/A;   DIAGNOSTIC LAPAROSCOPY     TONSILLECTOMY      SOCIAL HISTORY: Social History   Socioeconomic History   Marital status: Legally Separated    Spouse name: Not on file   Number of children: Not on file   Years of education: Not on file   Highest education level: Not on file  Occupational History   Not on file  Tobacco Use   Smoking status: Never   Smokeless tobacco: Never  Vaping Use   Vaping status: Never Used  Substance and Sexual Activity   Alcohol use: Yes    Alcohol/week: 4.0 standard drinks of alcohol    Types: 2 Glasses of wine, 2 Cans of beer per week    Comment: OCC   Drug use: No   Sexual activity: Not on file  Other Topics Concern   Not on file  Social History Narrative   Works at [employer]; lives in [place] with daughters; never smoked; no alcohol.   Social Determinants of Health   Financial Resource Strain: Low Risk  (02/15/2023)   Received from [facility], [facility]   Overall Financial Resource Strain (CARDIA)    Difficulty of Paying Living Expenses: Not hard at all  Food Insecurity: No Food Insecurity (02/15/2023)   Received from [facility], [facility]   Hunger Vital Sign    Worried About Running Out of Food  in the Last Year: Never true    Ran Out of Food in the Last Year: Never true  Transportation Needs: No Transportation Needs (02/15/2023)   Received from [facility], [facility]   [facility] - Transportation    In the past 12 months, has lack of transportation kept you from medical appointments or from getting medications?: No    Lack of Transportation (Non-Medical): No  Physical Activity: Not on file  Stress: Not on file  Social Connections: Not on file  Intimate Partner Violence: Not on file    FAMILY HISTORY: Family History  Problem Relation Age of Onset   Varicose Veins Mother    Breast cancer Neg Hx     ALLERGIES:  is allergic to aspirin, shrimp extract, and shrimp [shellfish allergy].  MEDICATIONS:  Current Outpatient Medications  Medication Sig Dispense Refill   diclofenac (VOLTAREN) 75 MG EC tablet Take 75 mg by mouth 2 (two) times daily.     ibuprofen (ADVIL) 800 MG tablet Take 1 tablet (800 mg total) by mouth every 8 (eight) hours as needed. 30 tablet 3   tamoxifen (  NOLVADEX) 20 MG tablet Take 1 tablet (20 mg total) by mouth daily. 90 tablet 1   Vitamin D, Ergocalciferol, (DRISDOL) 1.25 MG (50000 UNIT) CAPS capsule Take 50,000 Units by mouth once a week.     No current facility-administered medications for this visit.      Marland Kitchen  PHYSICAL EXAMINATION: ECOG PERFORMANCE STATUS: 0 - Asymptomatic  Vitals:   06/14/23 1409  BP: 119/73  Pulse: 71  Temp: 98.3 F (36.8 C)  SpO2: 100%    Filed Weights   06/14/23 1409  Weight: 186 lb (84.4 kg)     Physical Exam HENT:     Head: Normocephalic and atraumatic.     Mouth/Throat:     Pharynx: No oropharyngeal exudate.  Eyes:     Pupils: Pupils are equal, round, and reactive to light.  Cardiovascular:     Rate and Rhythm: Normal rate and regular rhythm.  Pulmonary:     Effort: Pulmonary effort is normal. No respiratory distress.     Breath sounds: Normal breath sounds. No wheezing.   Abdominal:     General: Bowel sounds are normal. There is no distension.     Palpations: Abdomen is soft. There is no mass.     Tenderness: There is no abdominal tenderness. There is no guarding or rebound.  Musculoskeletal:        General: No tenderness. Normal range of motion.     Cervical back: Normal range of motion and neck supple.  Skin:    General: Skin is warm.  Neurological:     Mental Status: She is alert and oriented to person, place, and time.  Psychiatric:        Mood and Affect: Affect normal.      LABORATORY DATA:  I have reviewed the data as listed Lab Results  Component Value Date   WBC 6.2 06/14/2023   HGB 12.5 06/14/2023   HCT 38.0 06/14/2023   MCV 93.6 06/14/2023   PLT 229 06/14/2023   Recent Labs    06/14/23 1410  NA 136  K 4.1  CL 106  CO2 23  GLUCOSE 97  BUN 17  CREATININE 0.56  CALCIUM 8.9  GFRNONAA >60  PROT 7.6  ALBUMIN 3.9  AST 75*  ALT 87*  ALKPHOS 117  BILITOT 0.4    RADIOGRAPHIC STUDIES: I have personally reviewed the radiological images as listed and agreed with the findings in the report. MR HIP RIGHT WO CONTRAST  Result Date: 06/13/2023 CLINICAL DATA:  Right hip pain.  Osteoarthritis of the right hip. EXAM: MR OF THE RIGHT HIP WITHOUT CONTRAST TECHNIQUE: Multiplanar, multisequence MR imaging was performed. No intravenous contrast was administered. COMPARISON:  None Available. FINDINGS: Bones: No hip fracture, dislocation or avascular necrosis. No periosteal reaction or bone destruction. No aggressive osseous lesion. Mild osteoarthritis of bilateral SI joints. No SI joint widening or erosive changes. Degenerative disease with disc height loss at L5-S1. Articular cartilage and labrum Articular cartilage: Extensive full-thickness cartilage loss of the right femoral head and acetabulum with subchondral reactive marrow edema. Mild partial-thickness cartilage loss of the left femoral head and acetabulum with subchondral reactive marrow  changes in the left superior acetabulum. Labrum:  Right labral degeneration with a superior labral tear. Joint or bursal effusion Joint effusion: Large right hip joint effusion. No left hip joint effusion. No SI joint effusion. Bursae:  No bursal fluid. Muscles and tendons Flexors: Normal. Extensors: Normal. Abductors: Normal. Adductors: Mild edema in the adductor brevis bilaterally likely reflecting mild  muscle strain. Gluteals: Normal. Hamstrings: Normal. Other findings No pelvic free fluid. No fluid collection or hematoma. No inguinal lymphadenopathy. No inguinal hernia. IMPRESSION: 1. Severe osteoarthritis of the right hip. Large right hip joint effusion. Right labral degeneration with a superior labral tear. 2. Mild left moderate osteoarthritis of the left hip. 3. Mild edema in the adductor brevis bilaterally likely reflecting mild muscle strain. Electronically Signed   By: Elige Ko M.D.   On: 06/13/2023 08:41     ASSESSMENT & PLAN:   Carcinoma of upper-outer quadrant of right breast in female, estrogen receptor positive (HCC) #Breast cancer stage I ER/PR positive HER2 negative] postlumpectomy T1b; grade 1; no LVI] [low risk; NO oncotype; no chemo]. currently on Tamoxifen [started sep 1st, 2022]; [id number].  APRIL 2024-BIL mammogram- WNL [Dr.Byrnett].    # No evidence of any recurrence.  Continue tamoxifen tolerating well except for mild hot flashes.   # # right hip pain- MRI July 2024- Severe osteoarthritis of the right hip. Large right hip joint effusion. Right labral degeneration with a superior labral tear. Awaiting ortho eval- [facility].  Unlikely is causing hip pain.  # fatty liver- mild elevation of AST/ALT- recommend cutting down carbs.   #History of depression- not taking Celexa 20 mg/day-insomnia-not any worse.  Stable.  # DISPOSITION: # follow up in 6 months- MD; labs- cbc/cmp; vit D 25 levels--Dr.B   All questions were answered. The patient/family knows to call the clinic with any  problems, questions or concerns.    Earna Coder, MD 06/14/2023 3:07 PM

## 2023-06-16 ENCOUNTER — Other Ambulatory Visit: Payer: Self-pay | Admitting: Internal Medicine

## 2023-06-16 DIAGNOSIS — R7989 Other specified abnormal findings of blood chemistry: Secondary | ICD-10-CM

## 2023-06-20 ENCOUNTER — Ambulatory Visit
Admission: RE | Admit: 2023-06-20 | Discharge: 2023-06-20 | Disposition: A | Discharge status: Home / Self Care | Payer: 59 | Source: Ambulatory Visit | Attending: Internal Medicine | Admitting: Internal Medicine

## 2023-06-20 DIAGNOSIS — R7989 Other specified abnormal findings of blood chemistry: Secondary | ICD-10-CM | POA: Diagnosis present

## 2023-06-21 ENCOUNTER — Ambulatory Visit: Payer: 59 | Admitting: Radiation Oncology

## 2023-06-28 ENCOUNTER — Ambulatory Visit: Payer: Self-pay | Admitting: Radiation Oncology

## 2023-07-06 ENCOUNTER — Other Ambulatory Visit: Payer: Self-pay | Admitting: Internal Medicine

## 2023-07-12 ENCOUNTER — Ambulatory Visit
Admission: RE | Admit: 2023-07-12 | Discharge: 2023-07-12 | Disposition: A | Discharge status: Home / Self Care | Payer: 59 | Source: Ambulatory Visit | Attending: Radiation Oncology | Admitting: Radiation Oncology

## 2023-07-12 ENCOUNTER — Encounter: Payer: Self-pay | Admitting: Radiation Oncology

## 2023-07-12 VITALS — BP 107/53 | HR 61 | Temp 97.2°F | Resp 16 | Ht 60.0 in | Wt 181.1 lb

## 2023-07-12 DIAGNOSIS — M1990 Unspecified osteoarthritis, unspecified site: Secondary | ICD-10-CM | POA: Insufficient documentation

## 2023-07-12 DIAGNOSIS — Z170 Estrogen receptor positive status [ER+]: Secondary | ICD-10-CM | POA: Insufficient documentation

## 2023-07-12 DIAGNOSIS — Z79810 Long term (current) use of selective estrogen receptor modulators (SERMs): Secondary | ICD-10-CM | POA: Diagnosis not present

## 2023-07-12 DIAGNOSIS — C50411 Malignant neoplasm of upper-outer quadrant of right female breast: Secondary | ICD-10-CM | POA: Insufficient documentation

## 2023-07-12 DIAGNOSIS — Z923 Personal history of irradiation: Secondary | ICD-10-CM | POA: Insufficient documentation

## 2023-07-12 NOTE — Progress Notes (Signed)
Radiation Oncology Follow up Note  Name: Kelsey Chavez   Date:   07/12/2023 MRN:  [id number] DOB: 08/02/1966    This 57 y.o. female presents to the clinic today for 3-year follow-up status post whole breast radiation to her right breast for stage I ER positive invasive mammary carcinoma.  REFERRING PROVIDER: Barbette Reichmann, MD  HPI: Patient is a 57 year old female now out 2 years having completed whole breast radiation to her right breast for stage I ER positive invasive mammary carcinoma.  Seen today in routine follow-up she is doing well.  She specifically denies breast tenderness cough or bone pain..  She is currently on tamoxifen tolerating it well without side effect.  She had mammograms back in April which I have reviewed were BI-RADS 2 benign.  She also had an MRI scan in July which shows severe osteoarthritis of the right hip with a large right hip infusion.  She is scheduled for hip replacement surgery.  COMPLICATIONS OF TREATMENT: none  FOLLOW UP COMPLIANCE: keeps appointments   PHYSICAL EXAM:  BP (!) 107/53   Pulse 61   Temp (!) 97.2 F (36.2 C)   Resp 16   Ht 5' (1.524 m)   Wt 181 lb 1.6 oz (82.1 kg)   BMI 35.37 kg/m  Lungs are clear to A&P cardiac examination essentially unremarkable with regular rate and rhythm. No dominant mass or nodularity is noted in either breast in 2 positions examined. Incision is well-healed. No axillary or supraclavicular adenopathy is appreciated. Cosmetic result is excellent.  Well-developed well-nourished patient in NAD. HEENT reveals PERLA, EOMI, discs not visualized.  Oral cavity is clear. No oral mucosal lesions are identified. Neck is clear without evidence of cervical or supraclavicular adenopathy. Lungs are clear to A&P. Cardiac examination is essentially unremarkable with regular rate and rhythm without murmur rub or thrill. Abdomen is benign with no organomegaly or masses noted. Motor sensory and DTR levels are equal and symmetric in  the upper and lower extremities. Cranial nerves II through XII are grossly intact. Proprioception is intact. No peripheral adenopathy or edema is identified. No motor or sensory levels are noted. Crude visual fields are within normal range.  RADIOLOGY RESULTS: MRI of her hip as well as mammograms reviewed compatible with above-stated findings  PLAN: At the present time patient is doing well from a breast standpoint with no evidence of disease now out 2 years.  I will see her 1 more time in a year and then discontinue follow-up care.  She continues on tamoxifen without side effect.  Patient knows to call with any concerns.  I would like to take this opportunity to thank you for allowing me to participate in the care of your patient.Carmina Miller, MD

## 2023-07-27 ENCOUNTER — Encounter (HOSPITAL_BASED_OUTPATIENT_CLINIC_OR_DEPARTMENT_OTHER): Payer: Self-pay | Admitting: Orthopaedic Surgery

## 2023-07-27 ENCOUNTER — Ambulatory Visit (INDEPENDENT_AMBULATORY_CARE_PROVIDER_SITE_OTHER): Payer: 59 | Admitting: Orthopaedic Surgery

## 2023-07-27 ENCOUNTER — Ambulatory Visit (HOSPITAL_BASED_OUTPATIENT_CLINIC_OR_DEPARTMENT_OTHER): Payer: Self-pay | Admitting: Orthopaedic Surgery

## 2023-07-27 ENCOUNTER — Other Ambulatory Visit (HOSPITAL_BASED_OUTPATIENT_CLINIC_OR_DEPARTMENT_OTHER): Payer: Self-pay | Admitting: Orthopaedic Surgery

## 2023-07-27 DIAGNOSIS — M1611 Unilateral primary osteoarthritis, right hip: Secondary | ICD-10-CM | POA: Diagnosis not present

## 2023-07-27 MED ORDER — TRAMADOL HCL 50 MG PO TABS: 50.0000 mg | ORAL_TABLET | Freq: Four times a day (QID) | ORAL | 1 refills | Status: DC | PRN

## 2023-07-27 NOTE — Progress Notes (Signed)
Chief Complaint: Right hip pain     History of Present Illness:    Kelsey Chavez is a 57 y.o. female presents today with right groin based hip pain which has been ongoing now for the last several years.  This has become quite disabling to the point where she has a severe limp and cannot place her shoes.  She is here today as she was told that she needed a hip arthroplasty with Dr. Audelia Acton but was having a difficult time from an insurance perspective.  She has had an injection in the hip which gave her only several weeks of relief.  She has trialed physical therapy as well without relief.  She does work for [employer].    Surgical History:   none  PMH/PSH/Family History/Social History/Meds/Allergies:    Past Medical History:  Diagnosis Date   Cancer (HCC)    breast   Depression    Varicose veins of both lower extremities    Vertigo    Past Surgical History:  Procedure Laterality Date   BREAST BIOPSY Right 02/24/2021   u/s bx "   BREAST LUMPECTOMY     BREAST LUMPECTOMY WITH SENTINEL LYMPH NODE BIOPSY Right 03/24/2021   Procedure: BREAST LUMPECTOMY WITH SENTINEL LYMPH NODE BX;  Surgeon: Earline Mayotte, MD;  Location: [facility] ORS;  Service: General;  Laterality: Right;   CESAREAN SECTION  1996   CHOLECYSTECTOMY     COLONOSCOPY WITH PROPOFOL N/A 05/13/2019   Procedure: COLONOSCOPY WITH PROPOFOL;  Surgeon: Scot Jun, MD;  Location: [facility] ENDOSCOPY;  Service: Endoscopy;  Laterality: N/A;   COLONOSCOPY WITH PROPOFOL N/A 09/25/2019   Procedure: COLONOSCOPY WITH PROPOFOL;  Surgeon: Toledo, Boykin Nearing, MD;  Location: [facility] ENDOSCOPY;  Service: Gastroenterology;  Laterality: N/A;   DIAGNOSTIC LAPAROSCOPY     TONSILLECTOMY     Social History   Socioeconomic History   Marital status: Legally Separated    Spouse name: Not on file   Number of children: Not on file   Years of education: Not on file   Highest education level:  Not on file  Occupational History   Not on file  Tobacco Use   Smoking status: Never   Smokeless tobacco: Never  Vaping Use   Vaping status: Never Used  Substance and Sexual Activity   Alcohol use: Yes    Alcohol/week: 4.0 standard drinks of alcohol    Types: 2 Glasses of wine, 2 Cans of beer per week    Comment: OCC   Drug use: No   Sexual activity: Not on file  Other Topics Concern   Not on file  Social History Narrative   Works at [employer]; lives in [place] with daughters; never smoked; no alcohol.   Social Determinants of Health   Financial Resource Strain: Low Risk  (06/21/2023)   Received from [facility]   Overall Financial Resource Strain (CARDIA)    Difficulty of Paying Living Expenses: Not hard at all  Food Insecurity: No Food Insecurity (06/21/2023)   Received from [facility]   Hunger Vital Sign    Worried About Running Out of Food in the Last Year: Never true    Ran Out of Food in the Last Year: Never true  Transportation Needs: No Transportation Needs (06/21/2023)   Received  from [facility] - Transportation    In the past 12 months, has lack of transportation kept you from medical appointments or from getting medications?: No    Lack of Transportation (Non-Medical): No  Physical Activity: Not on file  Stress: Not on file  Social Connections: Not on file   Family History  Problem Relation Age of Onset   Varicose Veins Mother    Breast cancer Neg Hx    Allergies  Allergen Reactions   Aspirin     Other reaction(s): Dizziness   Shrimp Extract Itching   Shrimp [Shellfish Allergy] Itching   Current Outpatient Medications  Medication Sig Dispense Refill   ibuprofen (ADVIL) 800 MG tablet Take 1 tablet (800 mg total) by mouth every 8 (eight) hours as needed. 30 tablet 3   meloxicam (MOBIC) 15 MG tablet Take 15 mg by mouth daily.     tamoxifen (NOLVADEX) 20 MG tablet TAKE 1 TABLET BY MOUTH EVERY  DAY 90 tablet 1   Vitamin D, Ergocalciferol, (DRISDOL) 1.25 MG (50000 UNIT) CAPS capsule Take 50,000 Units by mouth once a week.     No current facility-administered medications for this visit.   No results found.  Review of Systems:   A ROS was performed including pertinent positives and negatives as documented in the HPI.  Physical Exam :   Constitutional: NAD and appears stated age Neurological: Alert and oriented Psych: Appropriate affect and cooperative There were no vitals taken for this visit.   Comprehensive Musculoskeletal Exam:    Tenderness about the femoral acetabular joint.  Any type of internal rotation to 20 degrees creates pain in the hip.  She is able to flex to 90 degrees again with pain.  Distal neurosensory exam is intact  Imaging:   Xray (3 views right hip): Advanced right hip osteoarthritis  MRI (right hip): End-stage right hip femoral acetabular osteoarthritis  I personally reviewed and interpreted the radiographs.   Assessment:   57 y.o. female with end-stage right hip femoral acetabular arthritis.  At this time she has failed multiple treatments including injections and physical therapy.  Given this she is no longer able to maintain an active and healthy lifestyle.  I did discuss that total arthroplasty I do believe would give her significant benefit.  Given this we will plan to refer her to my partner Dr. Roda Shutters for assessment  Plan :    -Plan for referral to Dr. Roda Shutters for assessment of right total hip arthroplasty     I personally saw and evaluated the patient, and participated in the management and treatment plan.  Huel Cote, MD Attending Physician, Orthopedic Surgery  This document was dictated using Dragon voice recognition software. A reasonable attempt at proof reading has been made to minimize errors.

## 2023-07-28 NOTE — Addendum Note (Signed)
Addended by: Jeanella Cara on: 07/28/2023 01:13 PM   Modules accepted: Orders

## 2023-07-31 ENCOUNTER — Ambulatory Visit: Admit: 2023-07-31 | Payer: 59 | Admitting: Orthopedic Surgery

## 2023-07-31 SURGERY — ARTHROPLASTY, HIP, TOTAL, ANTERIOR APPROACH
Anesthesia: Choice | Site: Hip | Laterality: Right

## 2023-08-03 ENCOUNTER — Ambulatory Visit (INDEPENDENT_AMBULATORY_CARE_PROVIDER_SITE_OTHER): Payer: 59 | Admitting: Orthopaedic Surgery

## 2023-08-03 ENCOUNTER — Other Ambulatory Visit (INDEPENDENT_AMBULATORY_CARE_PROVIDER_SITE_OTHER): Payer: 59

## 2023-08-03 DIAGNOSIS — M1611 Unilateral primary osteoarthritis, right hip: Secondary | ICD-10-CM | POA: Diagnosis not present

## 2023-08-03 NOTE — Progress Notes (Signed)
Office Visit Note   Patient: Kelsey Chavez           Date of Birth: Dec 21, 1965           MRN: [id number] Visit Date: 08/03/2023              Requested by: Huel Cote, MD [address] [place],  [place] [address] PCP: Barbette Reichmann, MD   Assessment & Plan: Visit Diagnoses:  1. Primary osteoarthritis of right hip     Plan: Patient is a very pleasant 57 year old female with end-stage right hip DJD.  She has a constant 8 out of 10 pain in the groin and anterior thigh.  She had a cortisone shot which helped for a couple days in May.  Tramadol helps her sleep but she has constant pain during the day and during ADLs.  Impression is severe right hip degenerative joint disease secondary to Osteoarthritis.  Imaging shows bone on bone joint space narrowing.  At this point, conservative treatments fail to provide any significant relief and the pain is severely affecting ADLs and quality of life.  Based on treatment options, the patient has elected to move forward with a hip replacement.  We have discussed the surgical risks that include but are not limited to infection, DVT, leg length discrepancy, numbness, tingling, incomplete relief of pain.  Recovery and prognosis were also reviewed.    Eunice Blase will call the patient to confirm surgery time.  Current anticoagulants: No antithrombotic Postop anticoagulation: Aspirin 81 mg Diabetic: No  Prior DVT/PE: No Tobacco use: No Clearances needed for surgery: None Anticipate discharge dispo: home   Language barrier increased the complexity of the visit.  Follow-Up Instructions: No follow-ups on file.   Orders:  Orders Placed This Encounter  Procedures   XR HIP UNILAT W OR W/O PELVIS 2-3 VIEWS RIGHT   No orders of the defined types were placed in this encounter.     Procedures: No procedures performed   Clinical Data: No additional findings.   Subjective: Chief Complaint  Patient presents with   Right Hip -  Pain    HPI Patient is a very pleasant 57 year old female referral from my partner Dr. Steward Drone for surgical consultation for right total hip replacement. Review of Systems  Constitutional: Negative.   HENT: Negative.    Eyes: Negative.   Respiratory: Negative.    Cardiovascular: Negative.   Endocrine: Negative.   Musculoskeletal: Negative.   Neurological: Negative.   Hematological: Negative.   Psychiatric/Behavioral: Negative.    All other systems reviewed and are negative.    Objective: Vital Signs: There were no vitals taken for this visit.  Physical Exam Vitals and nursing note reviewed.  Constitutional:      Appearance: She is well-developed.  HENT:     Head: Atraumatic.     Nose: Nose normal.  Eyes:     Extraocular Movements: Extraocular movements intact.  Cardiovascular:     Pulses: Normal pulses.  Pulmonary:     Effort: Pulmonary effort is normal.  Abdominal:     Palpations: Abdomen is soft.  Musculoskeletal:     Cervical back: Neck supple.  Skin:    General: Skin is warm.     Capillary Refill: Capillary refill takes less than 2 seconds.  Neurological:     Mental Status: She is alert. Mental status is at baseline.  Psychiatric:        Behavior: Behavior normal.        Thought Content: Thought  content normal.        Judgment: Judgment normal.     Ortho Exam Exam of the right hip shows significant pain with movement of the hip joint.  Antalgic gait. Specialty Comments:  No specialty comments available.  Imaging: XR HIP UNILAT W OR W/O PELVIS 2-3 VIEWS RIGHT  Result Date: 08/03/2023 X-rays of the right hip show bone-on-bone joint space narrowing and degenerative joint disease.  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    PMFS History: Patient Active Problem List   Diagnosis Date Noted   Genetic testing 09/29/2021   Carcinoma of upper-outer quadrant of right breast in female, estrogen receptor positive (HCC)  03/04/2021   Major depressive disorder, recurrent, mild (HCC) 09/16/2019   Unintended weight gain 09/16/2019   Pain in limb 07/27/2018   OSA (obstructive sleep apnea) 04/15/2018   Intermittent vertigo 02/08/2018   Snoring 02/08/2018   Decreased sexual desire 08/02/2017   Swelling of limb 12/27/2016   Morbid obesity with body mass index of 40.0-49.9 (HCC) 12/02/2014   Asymptomatic varicose veins of both lower extremities 03/25/2014   Absence of bladder continence 03/25/2014   Past Medical History:  Diagnosis Date   Cancer (HCC)    breast   Depression    Varicose veins of both lower extremities    Vertigo     Family History  Problem Relation Age of Onset   Varicose Veins Mother    Breast cancer Neg Hx     Past Surgical History:  Procedure Laterality Date   BREAST BIOPSY Right 02/24/2021   u/s bx "   BREAST LUMPECTOMY     BREAST LUMPECTOMY WITH SENTINEL LYMPH NODE BIOPSY Right 03/24/2021   Procedure: BREAST LUMPECTOMY WITH SENTINEL LYMPH NODE BX;  Surgeon: Earline Mayotte, MD;  Location: [facility] ORS;  Service: General;  Laterality: Right;   CESAREAN SECTION  1996   CHOLECYSTECTOMY     COLONOSCOPY WITH PROPOFOL N/A 05/13/2019   Procedure: COLONOSCOPY WITH PROPOFOL;  Surgeon: Scot Jun, MD;  Location: [facility] ENDOSCOPY;  Service: Endoscopy;  Laterality: N/A;   COLONOSCOPY WITH PROPOFOL N/A 09/25/2019   Procedure: COLONOSCOPY WITH PROPOFOL;  Surgeon: Toledo, Boykin Nearing, MD;  Location: [facility] ENDOSCOPY;  Service: Gastroenterology;  Laterality: N/A;   DIAGNOSTIC LAPAROSCOPY     TONSILLECTOMY     Social History   Occupational History   Not on file  Tobacco Use   Smoking status: Never   Smokeless tobacco: Never  Vaping Use   Vaping status: Never Used  Substance and Sexual Activity   Alcohol use: Yes    Alcohol/week: 4.0 standard drinks of alcohol    Types: 2 Glasses of wine, 2 Cans of beer per week    Comment: O[facility]   Drug use: No   Sexual activity: Not on file

## 2023-08-10 ENCOUNTER — Encounter: Payer: Self-pay | Admitting: Orthopaedic Surgery

## 2023-08-10 NOTE — Telephone Encounter (Signed)
Please see message. Thanks!

## 2023-08-11 NOTE — Telephone Encounter (Signed)
Please see other Dr. Roda Shutters message.

## 2023-08-29 ENCOUNTER — Telehealth: Payer: Self-pay | Admitting: Orthopaedic Surgery

## 2023-08-29 MED ORDER — ACETAMINOPHEN-CODEINE 300-30 MG PO TABS
1.0000 | ORAL_TABLET | Freq: Every day | ORAL | 0 refills | Status: AC | PRN
Start: 2023-08-29 — End: 2023-09-05

## 2023-08-29 NOTE — Telephone Encounter (Signed)
Notified patient.

## 2023-08-29 NOTE — Telephone Encounter (Signed)
Pt daughter called in for her pt stating she is still in pain taking the Tramadol [place] Prescribed and she would like to know is there something else she can take the pain from her hip is going into her knees and she has been having trouble walking and being able to get up from sitting. Please advise Pt is scheduled for THA 10/03/23 Please call Daughter on file

## 2023-08-29 NOTE — Addendum Note (Signed)
Addended by: Mayra Reel on: 08/29/2023 04:54 PM   Modules accepted: Orders

## 2023-08-29 NOTE — Telephone Encounter (Signed)
I'll send tylenol #3 to try

## 2023-09-14 ENCOUNTER — Other Ambulatory Visit: Payer: Self-pay | Admitting: Orthopaedic Surgery

## 2023-09-19 ENCOUNTER — Other Ambulatory Visit: Payer: Self-pay

## 2023-09-26 NOTE — Pre-Procedure Instructions (Signed)
Surgical Instructions   Your procedure is scheduled on October 03, 2023. Report to [facility] Main Entrance "A" at 5:30 A.M., then check in with the Admitting office. Any questions or running late day of surgery: call [phone]  Questions prior to your surgery date: call [phone], Monday-Friday, 8am-4pm. If you experience any cold or flu symptoms such as cough, fever, chills, shortness of breath, etc. between now and your scheduled surgery, please notify us at the above number.     Remember:  Do not eat after midnight the night before your surgery  You may drink clear liquids until 4:30 AM the morning of your surgery.   Clear liquids allowed are: Water, Non-Citrus Juices (without pulp), Carbonated Beverages, Clear Tea (no milk, honey, etc.), Black Coffee Only (NO MILK, CREAM OR POWDERED CREAMER of any kind), and Gatorade.  Patient Instructions  The night before surgery:  No food after midnight. ONLY clear liquids after midnight  The day of surgery (if you do NOT have diabetes):  Drink ONE (1) Pre-Surgery Clear Ensure by 4:30 AM the morning of surgery. Drink in one sitting. Do not sip.  This drink was given to you during your hospital  pre-op appointment visit.  Nothing else to drink after completing the  Pre-Surgery Clear Ensure.         If you have questions, please contact your surgeon's office.     Take these medicines the morning of surgery with A SIP OF WATER: tamoxifen (NOLVADEX)    May take these medicines IF NEEDED: acetaminophen (TYLENOL)    One week prior to surgery, STOP taking any Aspirin (unless otherwise instructed by your surgeon) Aleve, Naproxen, Ibuprofen, Motrin, Advil, Goody's, BC's, all herbal medications, fish oil, and non-prescription vitamins.                     Do NOT Smoke (Tobacco/Vaping) for 24 hours prior to your procedure.  If you use a CPAP at night, you may bring your mask/headgear for your overnight stay.   You will be asked to  remove any contacts, glasses, piercing's, hearing aid's, dentures/partials prior to surgery. Please bring cases for these items if needed.    Patients discharged the day of surgery will not be allowed to drive home, and someone needs to stay with them for 24 hours.  SURGICAL WAITING ROOM VISITATION Patients may have no more than 2 support people in the waiting area - these visitors may rotate.   Pre-op nurse will coordinate an appropriate time for 1 ADULT support person, who may not rotate, to accompany patient in pre-op.  Children under the age of 38 must have an adult with them who is not the patient and must remain in the main waiting area with an adult.  If the patient needs to stay at the hospital during part of their recovery, the visitor guidelines for inpatient rooms apply.  Please refer to the [facility] website for the visitor guidelines for any additional information.   If you received a COVID test during your pre-op visit  it is requested that you wear a mask when out in public, stay away from anyone that may not be feeling well and notify your surgeon if you develop symptoms. If you have been in contact with anyone that has tested positive in the last 10 days please notify you surgeon.      Pre-operative 5 CHG Bathing Instructions   You can play a key role in reducing the risk of infection  after surgery. Your skin needs to be as free of germs as possible. You can reduce the number of germs on your skin by washing with CHG (chlorhexidine gluconate) soap before surgery. CHG is an antiseptic soap that kills germs and continues to kill germs even after washing.   DO NOT use if you have an allergy to chlorhexidine/CHG or antibacterial soaps. If your skin becomes reddened or irritated, stop using the CHG and notify one of our RNs at [phone].   Please shower with the CHG soap starting 4 days before surgery using the following schedule:     Please keep in mind the following:   DO NOT shave, including legs and underarms, starting the day of your first shower.   You may shave your face at any point before/day of surgery.  Place clean sheets on your bed the day you start using CHG soap. Use a clean washcloth (not used since being washed) for each shower. DO NOT sleep with pets once you start using the CHG.   CHG Shower Instructions:  Wash your face and private area with normal soap. If you choose to wash your hair, wash first with your normal shampoo.  After you use shampoo/soap, rinse your hair and body thoroughly to remove shampoo/soap residue.  Turn the water OFF and apply about 3 tablespoons (45 ml) of CHG soap to a CLEAN washcloth.  Apply CHG soap ONLY FROM YOUR NECK DOWN TO YOUR TOES (washing for 3-5 minutes)  DO NOT use CHG soap on face, private areas, open wounds, or sores.  Pay special attention to the area where your surgery is being performed.  If you are having back surgery, having someone wash your back for you may be helpful. Wait 2 minutes after CHG soap is applied, then you may rinse off the CHG soap.  Pat dry with a clean towel  Put on clean clothes/pajamas   If you choose to wear lotion, please use ONLY the CHG-compatible lotions on the back of this paper.   Additional instructions for the day of surgery: DO NOT APPLY any lotions, deodorants, cologne, or perfumes.   Do not bring valuables to the hospital. [facility] is not responsible for any belongings/valuables. Do not wear nail polish, gel polish, artificial nails, or any other type of covering on natural nails (fingers and toes) Do not wear jewelry or makeup Put on clean/comfortable clothes.  Please brush your teeth.  Ask your nurse before applying any prescription medications to the skin.     CHG Compatible Lotions   Aveeno Moisturizing lotion  Cetaphil Moisturizing Cream  Cetaphil Moisturizing Lotion  Clairol Herbal Essence Moisturizing Lotion, Dry Skin  Clairol Herbal Essence  Moisturizing Lotion, Extra Dry Skin  Clairol Herbal Essence Moisturizing Lotion, Normal Skin  Curel Age Defying Therapeutic Moisturizing Lotion with Alpha Hydroxy  Curel Extreme Care Body Lotion  Curel Soothing Hands Moisturizing Hand Lotion  Curel Therapeutic Moisturizing Cream, Fragrance-Free  Curel Therapeutic Moisturizing Lotion, Fragrance-Free  Curel Therapeutic Moisturizing Lotion, Original Formula  Eucerin Daily Replenishing Lotion  Eucerin Dry Skin Therapy Plus Alpha Hydroxy Crme  Eucerin Dry Skin Therapy Plus Alpha Hydroxy Lotion  Eucerin Original Crme  Eucerin Original Lotion  Eucerin Plus Crme Eucerin Plus Lotion  Eucerin TriLipid Replenishing Lotion  Keri Anti-Bacterial Hand Lotion  Keri Deep Conditioning Original Lotion Dry Skin Formula Softly Scented  Keri Deep Conditioning Original Lotion, Fragrance Free Sensitive Skin Formula  Keri Lotion Fast Absorbing Fragrance Free Sensitive Skin Formula  Keri Lotion Fast  Absorbing Softly Scented Dry Skin Formula  Keri Original Lotion  Keri Skin Renewal Lotion Keri Silky Smooth Lotion  Keri Silky Smooth Sensitive Skin Lotion  Nivea Body Creamy Conditioning Oil  Nivea Body Extra Enriched Lotion  Nivea Body Original Lotion  Nivea Body Sheer Moisturizing Lotion Nivea Crme  Nivea Skin Firming Lotion  NutraDerm 30 Skin Lotion  NutraDerm Skin Lotion  NutraDerm Therapeutic Skin Cream  NutraDerm Therapeutic Skin Lotion  ProShield Protective Hand Cream  Provon moisturizing lotion  Please read over the following fact sheets that you were given.

## 2023-09-27 ENCOUNTER — Inpatient Hospital Stay (HOSPITAL_COMMUNITY)
Admission: RE | Admit: 2023-09-27 | Discharge: 2023-09-27 | Disposition: A | Discharge status: Home / Self Care | Payer: 59 | Source: Ambulatory Visit | Attending: Orthopaedic Surgery | Admitting: Orthopaedic Surgery

## 2023-09-27 ENCOUNTER — Other Ambulatory Visit: Payer: Self-pay

## 2023-09-27 ENCOUNTER — Encounter (HOSPITAL_COMMUNITY): Payer: Self-pay

## 2023-09-27 VITALS — BP 108/63 | HR 63 | Temp 98.3°F | Resp 18 | Ht 60.0 in | Wt 168.0 lb

## 2023-09-27 DIAGNOSIS — Z01812 Encounter for preprocedural laboratory examination: Secondary | ICD-10-CM | POA: Diagnosis not present

## 2023-09-27 DIAGNOSIS — Z01818 Encounter for other preprocedural examination: Secondary | ICD-10-CM | POA: Diagnosis present

## 2023-09-27 DIAGNOSIS — M1611 Unilateral primary osteoarthritis, right hip: Secondary | ICD-10-CM | POA: Diagnosis not present

## 2023-09-27 HISTORY — DX: Personal history of urinary calculi: Z87.442

## 2023-09-27 HISTORY — DX: Pneumonia, unspecified organism: J18.9

## 2023-09-27 HISTORY — DX: Anxiety disorder, unspecified: F41.9

## 2023-09-27 HISTORY — DX: Unspecified osteoarthritis, unspecified site: M19.90

## 2023-09-27 LAB — CBC
HCT: 38 % (ref 36.0–46.0)
Hemoglobin: 12.4 g/dL (ref 12.0–15.0)
MCH: 30.3 pg (ref 26.0–34.0)
MCHC: 32.6 g/dL (ref 30.0–36.0)
MCV: 92.9 fL (ref 80.0–100.0)
Platelets: 222 10*3/uL (ref 150–400)
RBC: 4.09 MIL/uL (ref 3.87–5.11)
RDW: 13 % (ref 11.5–15.5)
WBC: 4.9 10*3/uL (ref 4.0–10.5)
nRBC: 0 % (ref 0.0–0.2)

## 2023-09-27 LAB — BASIC METABOLIC PANEL
Anion gap: 7 (ref 5–15)
BUN: 10 mg/dL (ref 6–20)
CO2: 25 mmol/L (ref 22–32)
Calcium: 9.1 mg/dL (ref 8.9–10.3)
Chloride: 107 mmol/L (ref 98–111)
Creatinine, Ser: 0.52 mg/dL (ref 0.44–1.00)
GFR, Estimated: >60 mL/min (ref >60)
Glucose, Bld: 109 mg/dL — ABNORMAL HIGH (ref 70–99)
Potassium: 4.1 mmol/L (ref 3.5–5.1)
Sodium: 139 mmol/L (ref 135–145)

## 2023-09-27 LAB — TYPE AND SCREEN
ABO/RH(D): O POS
Antibody Screen: NEGATIVE

## 2023-09-27 LAB — SURGICAL PCR SCREEN
MRSA, PCR: NEGATIVE
Staphylococcus aureus: POSITIVE — AB

## 2023-09-27 NOTE — Progress Notes (Signed)
PCP - Dr. Barbette Reichmann Cardiologist - Denies  PPM/ICD - Denies Device Orders - n/a Rep Notified - n/a  Chest x-ray - n/a EKG - Denies Stress Test - Denies ECHO - Denies Cardiac Cath - Denies  Sleep Study - Denies CPAP - n/a  No DM  Last dose of GLP1 agonist- n/a GLP1 instructions: n/a  Blood Thinner Instructions: n/a Aspirin Instructions: n/a  ERAS Protcol - Clear liquids until 0430 morning of surgeyr PRE-SURGERY Ensure or G2- Ensure given to pt with instructions  COVID TEST- n/a   Anesthesia review: No   Patient denies shortness of breath, fever, cough and chest pain at PAT appointment. Pt denies any respiratory illness/infection in the last two months.    All instructions explained to the patient, with a verbal understanding of the material. Patient agrees to go over the instructions while at home for a better understanding. Patient also instructed to self quarantine after being tested for COVID-19. The opportunity to ask questions was provided.

## 2023-10-02 ENCOUNTER — Other Ambulatory Visit: Payer: Self-pay | Admitting: Physician Assistant

## 2023-10-02 DIAGNOSIS — M1611 Unilateral primary osteoarthritis, right hip: Secondary | ICD-10-CM | POA: Insufficient documentation

## 2023-10-02 MED ORDER — OXYCODONE-ACETAMINOPHEN 5-325 MG PO TABS: 1.0000 | ORAL_TABLET | Freq: Four times a day (QID) | ORAL | 0 refills | Status: DC | PRN

## 2023-10-02 MED ORDER — DOCUSATE SODIUM 100 MG PO CAPS: 100.0000 mg | ORAL_CAPSULE | Freq: Every day | ORAL | 2 refills | Status: AC | PRN

## 2023-10-02 MED ORDER — RIVAROXABAN 10 MG PO TABS: 10.0000 mg | ORAL_TABLET | Freq: Every day | ORAL | 0 refills | Status: DC

## 2023-10-02 MED ORDER — METHOCARBAMOL 750 MG PO TABS: 750.0000 mg | ORAL_TABLET | Freq: Two times a day (BID) | ORAL | 2 refills | Status: DC | PRN

## 2023-10-02 MED ORDER — ONDANSETRON HCL 4 MG PO TABS: 4.0000 mg | ORAL_TABLET | Freq: Three times a day (TID) | ORAL | 0 refills | Status: DC | PRN

## 2023-10-02 MED ORDER — TRANEXAMIC ACID 1000 MG/10ML IV SOLN
2000.0000 mg | INTRAVENOUS | Status: DC
  Filled 2023-10-02 (×2): qty 20

## 2023-10-02 NOTE — Anesthesia Preprocedure Evaluation (Signed)
Anesthesia Evaluation  Patient identified by MRN, date of birth, ID band Patient awake    Reviewed: Allergy & Precautions, NPO status , Patient's Chart, lab work & pertinent test results  History of Anesthesia Complications Negative for: history of anesthetic complications  Airway Mallampati: III  TM Distance: >3 FB Neck ROM: Full    Dental  (+) Dental Advisory Given,  Missing several teeth. Denies loose teeth.:   Pulmonary neg shortness of breath, sleep apnea (no CPAP) , neg COPD, neg recent URI   Pulmonary exam normal breath sounds clear to auscultation       Cardiovascular negative cardio ROS  Rhythm:Regular Rate:Normal     Neuro/Psych neg Seizures PSYCHIATRIC DISORDERS Anxiety Depression    vertigo    GI/Hepatic negative GI ROS, Neg liver ROS,,,  Endo/Other  negative endocrine ROS    Renal/GU negative Renal ROS     Musculoskeletal  (+) Arthritis , Osteoarthritis,    Abdominal  (+) + obese  Peds  Hematology negative hematology ROS (+) Lab Results      Component                Value               Date                      WBC                      4.9                 09/27/2023                HGB                      12.4                09/27/2023                HCT                      38.0                09/27/2023                MCV                      92.9                09/27/2023                PLT                      222                 09/27/2023              Anesthesia Other Findings   Reproductive/Obstetrics H/o right breast cancer                             Anesthesia Physical Anesthesia Plan  ASA: 2  Anesthesia Plan: MAC and Spinal   Post-op Pain Management: Tylenol PO (pre-op)*   Induction: Intravenous  PONV Risk Score and Plan: 2 and Ondansetron, Dexamethasone, Propofol infusion, TIVA and Treatment may vary due to age or medical condition  Airway Management  Planned: Natural Airway and Simple  Face Mask  Additional Equipment:   Intra-op Plan:   Post-operative Plan:   Informed Consent: I have reviewed the patients History and Physical, chart, labs and discussed the procedure including the risks, benefits and alternatives for the proposed anesthesia with the patient or authorized representative who has indicated his/her understanding and acceptance.     Dental advisory given  Plan Discussed with: CRNA and Anesthesiologist  Anesthesia Plan Comments: (I have discussed risks of neuraxial anesthesia including but not limited to infection, bleeding, nerve injury, back pain, headache, seizures, and failure of block. Patient denies bleeding disorders and is not currently anticoagulated. Labs have been reviewed. Risks and benefits discussed. All patient's questions answered.   Discussed with patient risks of MAC including, but not limited to, minor pain or discomfort, hearing people in the room, and possible need for backup general anesthesia. Risks for general anesthesia also discussed including, but not limited to, sore throat, hoarse voice, chipped/damaged teeth, injury to vocal cords, nausea and vomiting, allergic reactions, lung infection, heart attack, stroke, and death. All questions answered. )       Anesthesia Quick Evaluation

## 2023-10-03 ENCOUNTER — Other Ambulatory Visit: Payer: Self-pay

## 2023-10-03 ENCOUNTER — Encounter (HOSPITAL_COMMUNITY): Payer: Self-pay | Admitting: Orthopaedic Surgery

## 2023-10-03 ENCOUNTER — Ambulatory Visit (HOSPITAL_COMMUNITY): Payer: 59

## 2023-10-03 ENCOUNTER — Ambulatory Visit (HOSPITAL_COMMUNITY): Payer: Self-pay | Admitting: Anesthesiology

## 2023-10-03 ENCOUNTER — Observation Stay (HOSPITAL_COMMUNITY)
Admission: RE | Admit: 2023-10-03 | Discharge: 2023-10-04 | Disposition: A | Discharge status: Home / Self Care | Payer: 59 | Source: Ambulatory Visit | Attending: Orthopaedic Surgery | Admitting: Orthopaedic Surgery

## 2023-10-03 ENCOUNTER — Encounter (HOSPITAL_COMMUNITY)
Admission: RE | Disposition: A | Discharge status: Home / Self Care | Payer: Self-pay | Source: Ambulatory Visit | Attending: Orthopaedic Surgery

## 2023-10-03 ENCOUNTER — Ambulatory Visit (HOSPITAL_BASED_OUTPATIENT_CLINIC_OR_DEPARTMENT_OTHER): Payer: 59 | Admitting: Anesthesiology

## 2023-10-03 ENCOUNTER — Observation Stay (HOSPITAL_COMMUNITY): Payer: 59

## 2023-10-03 DIAGNOSIS — Z853 Personal history of malignant neoplasm of breast: Secondary | ICD-10-CM | POA: Diagnosis not present

## 2023-10-03 DIAGNOSIS — Z96641 Presence of right artificial hip joint: Secondary | ICD-10-CM

## 2023-10-03 DIAGNOSIS — M1611 Unilateral primary osteoarthritis, right hip: Secondary | ICD-10-CM | POA: Diagnosis present

## 2023-10-03 DIAGNOSIS — Z7901 Long term (current) use of anticoagulants: Secondary | ICD-10-CM | POA: Diagnosis not present

## 2023-10-03 DIAGNOSIS — Z79899 Other long term (current) drug therapy: Secondary | ICD-10-CM | POA: Diagnosis not present

## 2023-10-03 HISTORY — PX: TOTAL HIP ARTHROPLASTY: SHX124

## 2023-10-03 LAB — ABO/RH: ABO/RH(D): O POS

## 2023-10-03 SURGERY — ARTHROPLASTY, HIP, TOTAL, ANTERIOR APPROACH
Anesthesia: Monitor Anesthesia Care | Site: Hip | Laterality: Right

## 2023-10-03 MED ORDER — DEXAMETHASONE SODIUM PHOSPHATE 10 MG/ML IJ SOLN
INTRAMUSCULAR | Status: AC
  Filled 2023-10-03: qty 1

## 2023-10-03 MED ORDER — PROPOFOL 10 MG/ML IV BOLUS
INTRAVENOUS | Status: DC | PRN
  Administered 2023-10-03: 30 mg via INTRAVENOUS
  Administered 2023-10-03: 50 mg via INTRAVENOUS

## 2023-10-03 MED ORDER — LACTATED RINGERS IV SOLN: INTRAVENOUS | Status: DC

## 2023-10-03 MED ORDER — FENTANYL CITRATE (PF) 100 MCG/2ML IJ SOLN
INTRAMUSCULAR | Status: AC
  Filled 2023-10-03: qty 2

## 2023-10-03 MED ORDER — CHLORHEXIDINE GLUCONATE CLOTH 2 % EX PADS
6.0000 | MEDICATED_PAD | Freq: Every day | CUTANEOUS | Status: DC
Start: 2023-10-03 — End: 2023-10-04
  Administered 2023-10-03 – 2023-10-04 (×2): 6 via TOPICAL

## 2023-10-03 MED ORDER — ACETAMINOPHEN 325 MG PO TABS: 325.0000 mg | ORAL_TABLET | Freq: Four times a day (QID) | ORAL | Status: DC | PRN

## 2023-10-03 MED ORDER — METOCLOPRAMIDE HCL 5 MG/ML IJ SOLN: 5.0000 mg | Freq: Three times a day (TID) | INTRAMUSCULAR | Status: DC | PRN

## 2023-10-03 MED ORDER — PHENOL 1.4 % MT LIQD: 1.0000 | OROMUCOSAL | Status: DC | PRN

## 2023-10-03 MED ORDER — BUPIVACAINE IN DEXTROSE 0.75-8.25 % IT SOLN
INTRATHECAL | Status: DC | PRN
  Administered 2023-10-03: 1.4 mL via INTRATHECAL

## 2023-10-03 MED ORDER — TRANEXAMIC ACID 1000 MG/10ML IV SOLN
INTRAVENOUS | Status: DC | PRN
  Administered 2023-10-03: 2000 mg via TOPICAL

## 2023-10-03 MED ORDER — ALUM & MAG HYDROXIDE-SIMETH 200-200-20 MG/5ML PO SUSP: 30.0000 mL | ORAL | Status: DC | PRN

## 2023-10-03 MED ORDER — CHLORHEXIDINE GLUCONATE 0.12 % MT SOLN
15.0000 mL | Freq: Once | OROMUCOSAL | Status: AC
  Administered 2023-10-03: 15 mL via OROMUCOSAL
  Filled 2023-10-03: qty 15

## 2023-10-03 MED ORDER — SORBITOL 70 % SOLN: 30.0000 mL | Freq: Every day | Status: DC | PRN

## 2023-10-03 MED ORDER — OXYCODONE HCL 5 MG/5ML PO SOLN: 5.0000 mg | Freq: Once | ORAL | Status: DC | PRN

## 2023-10-03 MED ORDER — ORAL CARE MOUTH RINSE: 15.0000 mL | Freq: Once | OROMUCOSAL | Status: AC

## 2023-10-03 MED ORDER — METHOCARBAMOL 1000 MG/10ML IJ SOLN: 500.0000 mg | Freq: Four times a day (QID) | INTRAMUSCULAR | Status: DC | PRN

## 2023-10-03 MED ORDER — PROPOFOL 1000 MG/100ML IV EMUL
INTRAVENOUS | Status: AC
  Filled 2023-10-03: qty 100

## 2023-10-03 MED ORDER — DEXAMETHASONE SODIUM PHOSPHATE 10 MG/ML IJ SOLN
10.0000 mg | Freq: Once | INTRAMUSCULAR | Status: AC
  Administered 2023-10-04: 10 mg via INTRAVENOUS
  Filled 2023-10-03: qty 1

## 2023-10-03 MED ORDER — METOCLOPRAMIDE HCL 5 MG PO TABS: 5.0000 mg | ORAL_TABLET | Freq: Three times a day (TID) | ORAL | Status: DC | PRN

## 2023-10-03 MED ORDER — TRANEXAMIC ACID-NACL 1000-0.7 MG/100ML-% IV SOLN
1000.0000 mg | Freq: Once | INTRAVENOUS | Status: AC
  Administered 2023-10-03: 1000 mg via INTRAVENOUS

## 2023-10-03 MED ORDER — POVIDONE-IODINE 10 % EX SWAB: 2.0000 | Freq: Once | CUTANEOUS | Status: DC

## 2023-10-03 MED ORDER — DOCUSATE SODIUM 100 MG PO CAPS
100.0000 mg | ORAL_CAPSULE | Freq: Two times a day (BID) | ORAL | Status: DC
  Administered 2023-10-03 – 2023-10-04 (×3): 100 mg via ORAL
  Filled 2023-10-03 (×3): qty 1

## 2023-10-03 MED ORDER — DEXAMETHASONE SODIUM PHOSPHATE 10 MG/ML IJ SOLN
INTRAMUSCULAR | Status: DC | PRN
  Administered 2023-10-03: 10 mg via INTRAVENOUS

## 2023-10-03 MED ORDER — MENTHOL 3 MG MT LOZG
1.0000 | LOZENGE | OROMUCOSAL | Status: DC | PRN
  Filled 2023-10-03: qty 9

## 2023-10-03 MED ORDER — EPHEDRINE 5 MG/ML INJ
INTRAVENOUS | Status: AC
  Filled 2023-10-03: qty 5

## 2023-10-03 MED ORDER — AMISULPRIDE (ANTIEMETIC) 5 MG/2ML IV SOLN: 10.0000 mg | Freq: Once | INTRAVENOUS | Status: DC | PRN

## 2023-10-03 MED ORDER — ALBUMIN HUMAN 5 % IV SOLN: INTRAVENOUS | Status: DC | PRN

## 2023-10-03 MED ORDER — MUPIROCIN 2 % EX OINT
1.0000 | TOPICAL_OINTMENT | Freq: Two times a day (BID) | CUTANEOUS | Status: DC
  Administered 2023-10-03 – 2023-10-04 (×3): 1 via NASAL
  Filled 2023-10-03: qty 22

## 2023-10-03 MED ORDER — HYDROMORPHONE HCL 1 MG/ML IJ SOLN
0.5000 mg | INTRAMUSCULAR | Status: DC | PRN
  Administered 2023-10-03: 1 mg via INTRAVENOUS
  Filled 2023-10-03: qty 1

## 2023-10-03 MED ORDER — METHOCARBAMOL 500 MG PO TABS
500.0000 mg | ORAL_TABLET | Freq: Four times a day (QID) | ORAL | Status: DC | PRN
  Administered 2023-10-03 – 2023-10-04 (×2): 500 mg via ORAL
  Filled 2023-10-03 (×2): qty 1

## 2023-10-03 MED ORDER — OXYCODONE HCL 5 MG PO TABS: 10.0000 mg | ORAL_TABLET | ORAL | Status: DC | PRN

## 2023-10-03 MED ORDER — FENTANYL CITRATE (PF) 100 MCG/2ML IJ SOLN
25.0000 ug | INTRAMUSCULAR | Status: DC | PRN
Start: 2023-10-03 — End: 2023-10-03
  Administered 2023-10-03: 50 ug via INTRAVENOUS
  Administered 2023-10-03: 25 ug via INTRAVENOUS

## 2023-10-03 MED ORDER — BUPIVACAINE-MELOXICAM ER 400-12 MG/14ML IJ SOLN
INTRAMUSCULAR | Status: DC | PRN
  Administered 2023-10-03: 400 mg

## 2023-10-03 MED ORDER — MIDAZOLAM HCL 2 MG/2ML IJ SOLN
INTRAMUSCULAR | Status: DC | PRN
  Administered 2023-10-03: 2 mg via INTRAVENOUS

## 2023-10-03 MED ORDER — SODIUM CHLORIDE 0.9 % IR SOLN
Status: DC | PRN
  Administered 2023-10-03: 1000 mL

## 2023-10-03 MED ORDER — ACETAMINOPHEN 500 MG PO TABS
1000.0000 mg | ORAL_TABLET | Freq: Once | ORAL | Status: AC
  Administered 2023-10-03: 1000 mg via ORAL
  Filled 2023-10-03: qty 2

## 2023-10-03 MED ORDER — ACETAMINOPHEN 500 MG PO TABS
1000.0000 mg | ORAL_TABLET | Freq: Four times a day (QID) | ORAL | Status: AC
  Administered 2023-10-03 – 2023-10-04 (×3): 1000 mg via ORAL
  Filled 2023-10-03 (×3): qty 2

## 2023-10-03 MED ORDER — VANCOMYCIN HCL 1 G IV SOLR
INTRAVENOUS | Status: DC | PRN
  Administered 2023-10-03: 1000 mg

## 2023-10-03 MED ORDER — TRANEXAMIC ACID-NACL 1000-0.7 MG/100ML-% IV SOLN
1000.0000 mg | INTRAVENOUS | Status: AC
  Administered 2023-10-03: 1000 mg via INTRAVENOUS
  Filled 2023-10-03: qty 100

## 2023-10-03 MED ORDER — PROPOFOL 500 MG/50ML IV EMUL
INTRAVENOUS | Status: DC | PRN
  Administered 2023-10-03: 50 ug/kg/min via INTRAVENOUS

## 2023-10-03 MED ORDER — ONDANSETRON HCL 4 MG/2ML IJ SOLN: 4.0000 mg | Freq: Four times a day (QID) | INTRAMUSCULAR | Status: DC | PRN

## 2023-10-03 MED ORDER — ONDANSETRON HCL 4 MG/2ML IJ SOLN
INTRAMUSCULAR | Status: AC
  Filled 2023-10-03: qty 2

## 2023-10-03 MED ORDER — PRONTOSAN WOUND IRRIGATION OPTIME
TOPICAL | Status: DC | PRN
  Administered 2023-10-03: 450 mL via TOPICAL

## 2023-10-03 MED ORDER — DIPHENHYDRAMINE HCL 12.5 MG/5ML PO ELIX: 25.0000 mg | ORAL_SOLUTION | ORAL | Status: DC | PRN

## 2023-10-03 MED ORDER — POLYETHYLENE GLYCOL 3350 17 G PO PACK
17.0000 g | PACK | Freq: Every day | ORAL | Status: DC
Start: 2023-10-03 — End: 2023-10-04
  Administered 2023-10-04: 17 g via ORAL
  Filled 2023-10-03: qty 1

## 2023-10-03 MED ORDER — EPHEDRINE SULFATE-NACL 50-0.9 MG/10ML-% IV SOSY
PREFILLED_SYRINGE | INTRAVENOUS | Status: DC | PRN
  Administered 2023-10-03 (×4): 5 mg via INTRAVENOUS

## 2023-10-03 MED ORDER — CEFAZOLIN SODIUM-DEXTROSE 2-4 GM/100ML-% IV SOLN
2.0000 g | INTRAVENOUS | Status: AC
  Administered 2023-10-03: 2 g via INTRAVENOUS
  Filled 2023-10-03: qty 100

## 2023-10-03 MED ORDER — MAGNESIUM CITRATE PO SOLN: 1.0000 | Freq: Once | ORAL | Status: DC | PRN

## 2023-10-03 MED ORDER — OXYCODONE HCL 5 MG PO TABS: 5.0000 mg | ORAL_TABLET | Freq: Once | ORAL | Status: DC | PRN

## 2023-10-03 MED ORDER — CEFAZOLIN SODIUM-DEXTROSE 2-4 GM/100ML-% IV SOLN
2.0000 g | Freq: Four times a day (QID) | INTRAVENOUS | Status: AC
  Administered 2023-10-03 (×2): 2 g via INTRAVENOUS
  Filled 2023-10-03 (×2): qty 100

## 2023-10-03 MED ORDER — OXYCODONE HCL 5 MG PO TABS
5.0000 mg | ORAL_TABLET | ORAL | Status: DC | PRN
  Administered 2023-10-03 – 2023-10-04 (×3): 10 mg via ORAL
  Filled 2023-10-03 (×3): qty 2

## 2023-10-03 MED ORDER — STERILE WATER FOR IRRIGATION IR SOLN
Status: DC | PRN
  Administered 2023-10-03: 1000 mL

## 2023-10-03 MED ORDER — RIVAROXABAN 10 MG PO TABS
10.0000 mg | ORAL_TABLET | Freq: Every day | ORAL | Status: DC
  Administered 2023-10-04: 10 mg via ORAL
  Filled 2023-10-03: qty 1

## 2023-10-03 MED ORDER — VANCOMYCIN HCL 1000 MG IV SOLR
INTRAVENOUS | Status: AC
  Filled 2023-10-03: qty 20

## 2023-10-03 MED ORDER — PANTOPRAZOLE SODIUM 40 MG PO TBEC
40.0000 mg | DELAYED_RELEASE_TABLET | Freq: Every day | ORAL | Status: DC
  Administered 2023-10-03 – 2023-10-04 (×2): 40 mg via ORAL
  Filled 2023-10-03 (×2): qty 1

## 2023-10-03 MED ORDER — ONDANSETRON HCL 4 MG PO TABS: 4.0000 mg | ORAL_TABLET | Freq: Four times a day (QID) | ORAL | Status: DC | PRN

## 2023-10-03 MED ORDER — 0.9 % SODIUM CHLORIDE (POUR BTL) OPTIME
TOPICAL | Status: DC | PRN
  Administered 2023-10-03: 1000 mL

## 2023-10-03 MED ORDER — PROPOFOL 10 MG/ML IV BOLUS
INTRAVENOUS | Status: AC
Start: 2023-10-03 — End: ?
  Filled 2023-10-03: qty 20

## 2023-10-03 MED ORDER — FENTANYL CITRATE (PF) 250 MCG/5ML IJ SOLN
INTRAMUSCULAR | Status: DC | PRN
  Administered 2023-10-03: 25 ug via INTRAVENOUS
  Administered 2023-10-03: 50 ug via INTRAVENOUS
  Administered 2023-10-03: 25 ug via INTRAVENOUS

## 2023-10-03 MED ORDER — PHENYLEPHRINE HCL-NACL 20-0.9 MG/250ML-% IV SOLN
INTRAVENOUS | Status: DC | PRN
  Administered 2023-10-03: 25 ug/min via INTRAVENOUS

## 2023-10-03 MED ORDER — OXYCODONE HCL ER 10 MG PO T12A
10.0000 mg | EXTENDED_RELEASE_TABLET | Freq: Two times a day (BID) | ORAL | Status: DC
  Administered 2023-10-03 – 2023-10-04 (×3): 10 mg via ORAL
  Filled 2023-10-03 (×3): qty 1

## 2023-10-03 MED ORDER — BUPIVACAINE-MELOXICAM ER 400-12 MG/14ML IJ SOLN
INTRAMUSCULAR | Status: AC
  Filled 2023-10-03: qty 1

## 2023-10-03 MED ORDER — ONDANSETRON HCL 4 MG/2ML IJ SOLN
INTRAMUSCULAR | Status: DC | PRN
  Administered 2023-10-03: 4 mg via INTRAVENOUS

## 2023-10-03 MED ORDER — FENTANYL CITRATE (PF) 250 MCG/5ML IJ SOLN
INTRAMUSCULAR | Status: AC
  Filled 2023-10-03: qty 5

## 2023-10-03 MED ORDER — MIDAZOLAM HCL 2 MG/2ML IJ SOLN
INTRAMUSCULAR | Status: AC
  Filled 2023-10-03: qty 2

## 2023-10-03 SURGICAL SUPPLY — 63 items
BAG COUNTER SPONGE SURGICOUNT (BAG) ×1 IMPLANT
BAG DECANTER FOR FLEXI CONT (MISCELLANEOUS) ×1 IMPLANT
BLADE SAG 18X100X1.27 (BLADE) ×1 IMPLANT
COOLER ICEMAN CLASSIC (MISCELLANEOUS) IMPLANT
COVER PERINEAL POST (MISCELLANEOUS) ×1 IMPLANT
COVER SURGICAL LIGHT HANDLE (MISCELLANEOUS) ×1 IMPLANT
CUP SECTOR GRIPTON 50MM (Cup) IMPLANT
DERMABOND ADVANCED .7 DNX12 (GAUZE/BANDAGES/DRESSINGS) IMPLANT
DRAPE C-ARM 42X72 X-RAY (DRAPES) ×1 IMPLANT
DRAPE POUCH INSTRU U-SHP 10X18 (DRAPES) ×1 IMPLANT
DRAPE STERI IOBAN 125X83 (DRAPES) ×1 IMPLANT
DRAPE U-SHAPE 47X51 STRL (DRAPES) ×2 IMPLANT
DRESSING AQUACEL AG SP 3.5X10 (GAUZE/BANDAGES/DRESSINGS) IMPLANT
DRSG AQUACEL AG ADV 3.5X10 (GAUZE/BANDAGES/DRESSINGS) ×1 IMPLANT
DRSG AQUACEL AG SP 3.5X10 (GAUZE/BANDAGES/DRESSINGS) ×1
DURAPREP 26ML APPLICATOR (WOUND CARE) ×2 IMPLANT
ELECT BLADE 4.0 EZ CLEAN MEGAD (MISCELLANEOUS) ×1
ELECT REM PT RETURN 9FT ADLT (ELECTROSURGICAL) ×1
ELECTRODE BLDE 4.0 EZ CLN MEGD (MISCELLANEOUS) ×1 IMPLANT
ELECTRODE REM PT RTRN 9FT ADLT (ELECTROSURGICAL) ×1 IMPLANT
GLOVE BIOGEL PI IND STRL 7.0 (GLOVE) ×2 IMPLANT
GLOVE BIOGEL PI IND STRL 7.5 (GLOVE) ×5 IMPLANT
GLOVE ECLIPSE 7.0 STRL STRAW (GLOVE) ×2 IMPLANT
GLOVE PROTEXIS LATEX SZ 7.5 (GLOVE) ×5
GLOVE SKINSENSE STRL SZ7.5 (GLOVE) ×1 IMPLANT
GLOVE SURG LATEX 7.5 PF (GLOVE) IMPLANT
GLOVE SURG SYN 7.5 E (GLOVE) ×2
GLOVE SURG SYN 7.5 PF PI (GLOVE) ×2 IMPLANT
GLOVE SURG UNDER POLY LF SZ7 (GLOVE) ×3 IMPLANT
GLOVE SURG UNDER POLY LF SZ7.5 (GLOVE) ×2 IMPLANT
GOWN STRL REUS W/ TWL LRG LVL3 (GOWN DISPOSABLE) IMPLANT
GOWN STRL REUS W/ TWL XL LVL3 (GOWN DISPOSABLE) ×1 IMPLANT
GOWN STRL REUS W/TWL LRG LVL3 (GOWN DISPOSABLE)
GOWN STRL REUS W/TWL XL LVL3 (GOWN DISPOSABLE) ×1
GOWN STRL SURGICAL XL XLNG (GOWN DISPOSABLE) ×1 IMPLANT
GOWN TOGA ZIPPER T7+ PEEL AWAY (MISCELLANEOUS) ×2 IMPLANT
HANDPIECE INTERPULSE COAX TIP (DISPOSABLE) ×1
HEAD FEMORAL 32 CERAMIC (Hips) IMPLANT
HOOD PEEL AWAY T7 (MISCELLANEOUS) ×1 IMPLANT
IV NS IRRIG 3000ML ARTHROMATIC (IV SOLUTION) ×1 IMPLANT
KIT BASIN OR (CUSTOM PROCEDURE TRAY) ×1 IMPLANT
LINER ACET PNNCL PLUS4 NEUTRAL (Hips) IMPLANT
MARKER SKIN DUAL TIP RULER LAB (MISCELLANEOUS) ×1 IMPLANT
NDL SPNL 18GX3.5 QUINCKE PK (NEEDLE) ×1 IMPLANT
NEEDLE SPNL 18GX3.5 QUINCKE PK (NEEDLE) ×1
PACK TOTAL JOINT (CUSTOM PROCEDURE TRAY) ×1 IMPLANT
PACK UNIVERSAL I (CUSTOM PROCEDURE TRAY) ×1 IMPLANT
PINNACLE PLUS 4 NEUTRAL (Hips) ×1 IMPLANT
SET HNDPC FAN SPRY TIP SCT (DISPOSABLE) ×1 IMPLANT
SOLUTION PRONTOSAN WOUND 350ML (IRRIGATION / IRRIGATOR) ×1 IMPLANT
STAPLER VISISTAT 35W (STAPLE) IMPLANT
STEM FEMORAL SZ 5MM STD ACTIS (Stem) IMPLANT
SUT ETHIBOND 2 V 37 (SUTURE) ×1 IMPLANT
SUT VIC AB 0 CT1 27XBRD ANBCTR (SUTURE) ×1 IMPLANT
SUT VIC AB 1 CTX36XBRD ANBCTR (SUTURE) ×1 IMPLANT
SUT VIC AB 2-0 CT1 TAPERPNT 27 (SUTURE) ×2 IMPLANT
SUT VIC AB CT1 27XBRD ANBCTRL (SUTURE) ×1
SYR 50ML LL SCALE MARK (SYRINGE) ×1 IMPLANT
TOWEL GREEN STERILE (TOWEL DISPOSABLE) ×1 IMPLANT
TRAY CATH INTERMITTENT SS 16FR (CATHETERS) IMPLANT
TRAY FOLEY W/BAG SLVR 16FR ST (SET/KITS/TRAYS/PACK) IMPLANT
TUBE SUCT ARGYLE STRL (TUBING) ×1 IMPLANT
YANKAUER SUCT BULB TIP NO VENT (SUCTIONS) ×1 IMPLANT

## 2023-10-03 NOTE — Anesthesia Procedure Notes (Signed)
Date/Time: 10/03/2023 7:33 AM  Performed by: Shary Decamp, CRNAPre-anesthesia Checklist: Patient identified, Emergency Drugs available, Suction available, Timeout performed and Patient being monitored Patient Re-evaluated:Patient Re-evaluated prior to induction Oxygen Delivery Method: Simple face mask

## 2023-10-03 NOTE — Transfer of Care (Signed)
Immediate Anesthesia Transfer of Care Note  Patient: Kelsey Chavez  Procedure(s) Performed: RIGHT TOTAL HIP ARTHROPLASTY ANTERIOR APPROACH (Right: Hip)  Patient Location: PACU  Anesthesia Type:Spinal  Level of Consciousness: awake, alert , oriented, and patient cooperative  Airway & Oxygen Therapy: Patient Spontanous Breathing and Patient connected to nasal cannula oxygen  Post-op Assessment: Report given to RN and Post -op Vital signs reviewed and stable  Post vital signs: Reviewed and stable  Last Vitals:  Vitals Value Taken Time  BP 101/59 10/03/23 0945  Temp 36.9 C 10/03/23 0939  Pulse 78 10/03/23 0947  Resp 11 10/03/23 0947  SpO2 98 % 10/03/23 0947  Vitals shown include unfiled device data.  Last Pain:  Vitals:   10/03/23 0556  TempSrc: Oral  PainSc: 0-No pain         Complications: No notable events documented.

## 2023-10-03 NOTE — Op Note (Signed)
RIGHT TOTAL HIP ARTHROPLASTY ANTERIOR APPROACH  Procedure Note Kelsey Chavez   [id number]  Pre-op Diagnosis: RIGHT hip osteoarthritis     Post-op Diagnosis: same  Operative Findings Severe synovitis Collapse of femoral head and remodeling Complete loss of articular cartilage   Operative Procedures  1. Total hip replacement; Right hip; uncemented [id number]   Surgeon: Gershon Mussel, M.D.  Assist: Oneal Grout, PA-C   Anesthesia: spinal  Prosthesis: Depuy Acetabulum: Pinnacle 50 mm Femur: Actis 5 std Head: 32 mm size: +1 Liner: +4 Bearing Type: ceramic/poly  Total Hip Arthroplasty (Anterior Approach) Op Note:  After informed consent was obtained and the operative extremity marked in the holding area, the patient was brought back to the operating room and placed supine on the HANA table. Next, the operative extremity was prepped and draped in normal sterile fashion. Surgical timeout occurred verifying patient identification, surgical site, surgical procedure and administration of antibiotics.  A 10 cm longitudinal incision was made starting from 2 fingerbreadths lateral and inferior to the ASIS towards the lateral aspect of the patella.  A Hueter approach to the hip was performed, using the interval between tensor fascia lata and sartorius.  Dissection was carried bluntly down onto the anterior hip capsule. The lateral femoral circumflex vessels were identified and coagulated. A capsulotomy was performed and the capsular flaps tagged for later repair.  The neck osteotomy was performed. The femoral head was removed which showed severe wear, remodeling of femoral head, loss of articular cartilage, the acetabular rim was cleared of soft tissue and osteophytes and attention was turned to reaming the acetabulum.  Sequential reaming was performed under fluoroscopic guidance down to the floor of the cotyloid fossa. We reamed to a size 49 mm, and then impacted the acetabular shell.   The liner was then placed after irrigation and attention turned to the femur.  After placing the femoral hook, the leg was taken to externally rotated, extended and adducted position taking care to perform soft tissue releases to allow for adequate mobilization of the femur. Soft tissue was cleared from the shoulder of the greater trochanter and the hook elevator used to improve exposure of the proximal femur. Sequential broaching performed up to a size 5. Trial neck and head were placed. The leg was brought back up to neutral and the construct reduced.  The position and sizing of components, offset and leg lengths were checked using fluoroscopy. Stability of the construct was checked in 45 degrees of hip extension and 90 degrees of external rotation without any subluxation, shuck or impingement of prosthesis. We dislocated the prosthesis, dropped the leg back into position, removed trial components, and irrigated copiously. The final stem and head was then placed, the leg brought back up, the system reduced and fluoroscopy used to verify positioning.  Antibiotic irrigation was placed in the surgical wound.   We irrigated, obtained hemostasis and closed the capsule using #2 ethibond suture.  A topical mixture of 0.25% bupivacaine and meloxicam was placed deep to the fascia.  One gram of vancomycin powder was placed in the surgical bed.   One gram of topical tranexamic acid was injected into the joint.  The fascia was closed with #1 vicryl plus, the deep fat layer was closed with 0 vicryl, the subcutaneous layers closed with 2.0 Vicryl Plus and the skin closed with 2.0 nylon and dermabond. A sterile dressing was applied. The patient was awakened in the operating room and taken to recovery in stable condition.  All  sponge, needle, and instrument counts were correct at the end of the case.   Tessa Lerner, my PA, was a medical necessity for opening, closing, limb positioning, retracting, exposing, and overall  facilitation and timely completion of the surgery.  Position: supine  Complications: see description of procedure.  Time Out: performed   Drains/Packing: none  Estimated blood loss: see anesthesia record  Returned to Recovery Room: in good condition.   Antibiotics: yes   Mechanical VTE (DVT) Prophylaxis: sequential compression devices, TED thigh-high  Chemical VTE (DVT) Prophylaxis: xarelto POD 1   Fluid Replacement: see anesthesia record  Specimens Removed: 1 to pathology   Sponge and Instrument Count Correct? yes   PACU: portable radiograph - low AP   Plan/RTC: Return in 2 weeks for staple removal. Weight Bearing/Load Lower Extremity: full  Hip precautions: none Suture Removal: 2 weeks   N. Glee Arvin, MD [facility] 9:05 AM   Implant Name Type Inv. Item Serial No. Manufacturer Lot No. LRB No. Used Action  PINNACLE PLUS 4 NEUTRAL - [id number] Hips PINNACLE PLUS 4 NEUTRAL  [employer] [id number] Right 1 Implanted  CUP SECTOR GRIPTON - [id number] Cup CUP SECTOR GRIPTON  [employer] [id number] Right 1 Implanted  HEAD FEMORAL 32 CERAMIC - [id number] Hips HEAD FEMORAL 32 CERAMIC  [employer] [id number] Right 1 Implanted  STEM FEMORAL SZ STD ACTIS - [id number] Stem STEM FEMORAL SZ STD ACTIS  [employer] [id number] Right 1 Implanted

## 2023-10-03 NOTE — Anesthesia Procedure Notes (Signed)
Spinal  Patient location during procedure: OR Start time: 10/03/2023 7:35 AM End time: 10/03/2023 7:38 AM Reason for block: surgical anesthesia Staffing Performed: anesthesiologist  Anesthesiologist: Linton Rump, MD Performed by: Linton Rump, MD Authorized by: Linton Rump, MD   Preanesthetic Checklist Completed: patient identified, IV checked, site marked, risks and benefits discussed, surgical consent, monitors and equipment checked, pre-op evaluation and timeout performed Spinal Block Patient position: sitting Prep: DuraPrep Patient monitoring: blood pressure and continuous pulse ox Approach: midline Location: L3-4 Injection technique: single-shot Needle Needle type: Pencan  Needle gauge: 24 G Needle length: 9 cm Additional Notes Risks and benefits of neuraxial anesthesia including, but not limited to, infection, bleeding, local anesthetic toxicity, headache, hypotension, back pain, block failure, etc. were discussed with the patient. The patient expressed understanding and consented to the procedure. I confirmed that the patient has no bleeding disorders and is not taking blood thinners. I confirmed the patient's last platelet count with the nurse. Monitors were applied. A time-out was performed immediately prior to the procedure. Sterile technique was used throughout the whole procedure.   1 attempt(s)

## 2023-10-03 NOTE — H&P (Signed)
PREOPERATIVE H&P  Chief Complaint: RIGHThip osteoarthritis  HPI: Kelsey Chavez is a 57 y.o. female who presents for surgical treatment of RIGHThip osteoarthritis.  She denies any changes in medical history.  Past Surgical History:  Procedure Laterality Date   BREAST BIOPSY Right 02/24/2021   u/s bx "   BREAST LUMPECTOMY WITH SENTINEL LYMPH NODE BIOPSY Right 03/24/2021   Procedure: BREAST LUMPECTOMY WITH SENTINEL LYMPH NODE BX;  Surgeon: Earline Mayotte, MD;  Location: [facility] ORS;  Service: General;  Laterality: Right;   CESAREAN SECTION  1996   CHOLECYSTECTOMY     COLONOSCOPY WITH PROPOFOL N/A 05/13/2019   Procedure: COLONOSCOPY WITH PROPOFOL;  Surgeon: Scot Jun, MD;  Location: [facility] ENDOSCOPY;  Service: Endoscopy;  Laterality: N/A;   COLONOSCOPY WITH PROPOFOL N/A 09/25/2019   Procedure: COLONOSCOPY WITH PROPOFOL;  Surgeon: Toledo, Boykin Nearing, MD;  Location: [facility] ENDOSCOPY;  Service: Gastroenterology;  Laterality: N/A;   DIAGNOSTIC LAPAROSCOPY     TONSILLECTOMY  2014   TUBAL LIGATION  12/30/2015   VAGINAL DELIVERY  12/29/2005   VAGINAL DELIVERY  2000   Social History   Socioeconomic History   Marital status: Legally Separated    Spouse name: Not on file   Number of children: Not on file   Years of education: Not on file   Highest education level: Not on file  Occupational History   Not on file  Tobacco Use   Smoking status: Never   Smokeless tobacco: Never  Vaping Use   Vaping status: Never Used  Substance and Sexual Activity   Alcohol use: Not Currently    Comment: Occasionally   Drug use: No   Sexual activity: Yes  Other Topics Concern   Not on file  Social History Narrative   Works at [employer]; lives in [place] with daughters; never smoked; no alcohol.   Social Determinants of Health   Financial Resource Strain: Low Risk  (06/21/2023)   Received from [facility]   Overall Financial Resource Strain (CARDIA)    Difficulty of  Paying Living Expenses: Not hard at all  Food Insecurity: No Food Insecurity (06/21/2023)   Received from [facility]   Hunger Vital Sign    Worried About Running Out of Food in the Last Year: Never true    Ran Out of Food in the Last Year: Never true  Transportation Needs: No Transportation Needs (06/21/2023)   Received from [facility] - Transportation    In the past 12 months, has lack of transportation kept you from medical appointments or from getting medications?: No    Lack of Transportation (Non-Medical): No  Physical Activity: Not on file  Stress: Not on file  Social Connections: Not on file   Family History  Problem Relation Age of Onset   Varicose Veins Mother    Breast cancer Neg Hx    Allergies  Allergen Reactions   Aspirin     Other reaction(s): Dizziness   Shrimp [Shellfish Allergy] Itching   Prior to Admission medications   Medication Sig Start Date End Date Taking? Authorizing Provider  acetaminophen (TYLENOL) 650 MG CR tablet Take 1,300 mg by mouth every 8 (eight) hours as needed for pain.   Yes [provider]  docusate sodium (COLACE) 100 MG capsule Take 1 capsule (100 mg total) by mouth daily as needed. 10/02/23 10/01/24  Cristie Hem, PA-C  methocarbamol (ROBAXIN-750) 750 MG tablet Take 1 tablet (750 mg  total) by mouth 2 (two) times daily as needed for muscle spasms. 10/02/23   Cristie Hem, PA-C  ondansetron (ZOFRAN) 4 MG tablet Take 1 tablet (4 mg total) by mouth every 8 (eight) hours as needed for nausea or vomiting. 10/02/23   Cristie Hem, PA-C  oxyCODONE-acetaminophen (PERCOCET) 5-325 MG tablet Take 1-2 tablets by mouth every 6 (six) hours as needed. 10/02/23   Cristie Hem, PA-C  rivaroxaban (XARELTO) 10 MG TABS tablet Take 1 tablet (10 mg total) by mouth daily. To be taken after surgery to prevent blood clots 10/02/23   Cristie Hem, PA-C  tamoxifen (NOLVADEX) 20 MG tablet TAKE 1  TABLET BY MOUTH EVERY DAY 07/06/23  Yes Earna Coder, MD  Vitamin D, Ergocalciferol, (DRISDOL) 1.25 MG (50000 UNIT) CAPS capsule Take 50,000 Units by mouth every Monday. 10/09/22  Yes [provider]     Positive ROS: All other systems have been reviewed and were otherwise negative with the exception of those mentioned in the HPI and as above.  Physical Exam: General: Alert, no acute distress Cardiovascular: No pedal edema Respiratory: No cyanosis, no use of accessory musculature GI: abdomen soft Skin: No lesions in the area of chief complaint Neurologic: Sensation intact distally Psychiatric: Patient is competent for consent with normal mood and affect Lymphatic: no lymphedema  MUSCULOSKELETAL: exam stable  Assessment: RIGHThip osteoarthritis  Plan: Plan for Procedure(s): RIGHT TOTAL HIP ARTHROPLASTY ANTERIOR APPROACH  The risks benefits and alternatives were discussed with the patient including but not limited to the risks of nonoperative treatment, versus surgical intervention including infection, bleeding, nerve injury,  blood clots, cardiopulmonary complications, morbidity, mortality, among others, and they were willing to proceed.   Glee Arvin, MD 10/03/2023 6:29 AM

## 2023-10-03 NOTE — TOC CM/SW Note (Signed)
Pt pre-arranged with [facility] for [facility] services through MD office.

## 2023-10-03 NOTE — Evaluation (Addendum)
Physical Therapy Evaluation Patient Details Name: Kelsey Chavez MRN: [id number] DOB: [birth year] Today's Date: 10/03/2023  History of Present Illness  Patient is 57 y.o. female s/p Rt THA anterior approach on 10/03/23. PMH significant for anxiety, depression, OA, Breast CA, Vertigo.   Clinical Impression  Kelsey Chavez is a 57 y.o. female POD 0 s/p Rt THA. Patient reports independent with mobility at baseline. Patient is now limited by functional impairments (see PT problem list below) and requires min assist for bed mobility, transfers, and gait with RW. Patient was able to ambulate ~60 feet with RW and min assist. Patient instructed in exercise to facilitate ROM and circulation to manage edema. Patient will benefit from continued skilled PT interventions to address impairments and progress towards PLOF. Acute PT will follow to progress mobility and stair training in preparation for safe discharge home.     Vitals During Session:  Position BP (mmHg)  Supine 117/82  Seated 71/48  Seated+2 minutes 98/70  Standing 95/68        If plan is discharge home, recommend the following: A little help with walking and/or transfers;A little help with bathing/dressing/bathroom;Assistance with cooking/housework;Help with stairs or ramp for entrance;Assist for transportation   Can travel by private vehicle        Equipment Recommendations Rolling walker (2 wheels)  Recommendations for Other Services       Functional Status Assessment Patient has had a recent decline in their functional status and demonstrates the ability to make significant improvements in function in a reasonable and predictable amount of time.     Precautions / Restrictions Precautions Precautions: Fall Restrictions Weight Bearing Restrictions: No      Mobility  Bed Mobility Overal bed mobility: Needs Assistance Bed Mobility: Supine to Sit     Supine to sit: Min assist, HOB elevated, Used rails      General bed mobility comments: cues to sequence walking bil LE's off EOB and use of bed rail to pivot hips and raiset runk. min assist to sit upright fully.    Transfers Overall transfer level: Needs assistance Equipment used: Rolling walker (2 wheels) Transfers: Sit to/from Stand, Bed to chair/wheelchair/BSC Sit to Stand: Contact guard assist   Step pivot transfers: Min assist       General transfer comment: Cues for hand placement to power up to RW, CGA for safety and min assist to guide RW wtih turn bed>chair.    Ambulation/Gait Ambulation/Gait assistance: Min assist, Contact guard assist Gait Distance (Feet): 60 Feet Assistive device: Rolling walker (2 wheels) Gait Pattern/deviations: Step-to pattern, Step-through pattern, Decreased stance time - right, Decreased stride length, Decreased weight shift to right Gait velocity: decr     General Gait Details: cues for step to pattern and progression to step through as gait progressed. Min assist and cues to maintain safe position to RW, pt tends to advance walker too far anteriorly at start.  Stairs            Wheelchair Mobility     Tilt Bed    Modified Rankin (Stroke Patients Only)       Balance Overall balance assessment: Needs assistance Sitting-balance support: Feet supported Sitting balance-Leahy Scale: Good     Standing balance support: Bilateral upper extremity supported, During functional activity, Reliant on assistive device for balance Standing balance-Leahy Scale: Poor Standing balance comment: reliant on RW  Pertinent Vitals/Pain Pain Assessment Pain Assessment: No/denies pain    Home Living Family/patient expects to be discharged to:: Private residence Living Arrangements: Children Available Help at Discharge: Family Type of Home: House Home Access: Stairs to enter Entrance Stairs-Rails: Right Entrance Stairs-Number of Steps: 5   Home Layout: One  level Home Equipment: None      Prior Function Prior Level of Function : Independent/Modified Independent;Working/employed;Driving                     Extremity/Trunk Assessment   Upper Extremity Assessment Upper Extremity Assessment: Overall WFL for tasks assessed    Lower Extremity Assessment Lower Extremity Assessment: Overall WFL for tasks assessed;RLE deficits/detail;LLE deficits/detail RLE Deficits / Details: limited due to hip pain, good quad activation, gluteal activation, 4/5 for DF/PF RLE Sensation: WNL RLE Coordination: WNL LLE Deficits / Details: grossly 4/5 throughout LLE Sensation: WNL LLE Coordination: WNL    Cervical / Trunk Assessment Cervical / Trunk Assessment: Normal  Communication   Communication Communication: No apparent difficulties  Cognition Arousal: Alert Behavior During Therapy: WFL for tasks assessed/performed, Anxious Overall Cognitive Status: Within Functional Limits for tasks assessed                                 General Comments: pt anxious about pain in Rt hip        General Comments      Exercises Total Joint Exercises Ankle Circles/Pumps: AROM, Left, 20 reps Heel Slides: AAROM, Right, Seated (2)   Assessment/Plan    PT Assessment Patient needs continued PT services  PT Problem List Decreased strength;Decreased activity tolerance;Decreased balance;Decreased range of motion;Decreased mobility;Decreased knowledge of use of DME;Decreased safety awareness;Decreased knowledge of precautions;Pain       PT Treatment Interventions DME instruction;Gait training;Stair training;Functional mobility training;Therapeutic activities;Therapeutic exercise;Balance training;Neuromuscular re-education;Cognitive remediation;Patient/family education    PT Goals (Current goals can be found in the Care Plan section)  Acute Rehab PT Goals Patient Stated Goal: dance again PT Goal Formulation: With patient/family Time For Goal  Achievement: 10/10/23 Potential to Achieve Goals: Good    Frequency 7X/week     Co-evaluation               AM-PAC PT "6 Clicks" Mobility  Outcome Measure Help needed turning from your back to your side while in a flat bed without using bedrails?: A Little Help needed moving from lying on your back to sitting on the side of a flat bed without using bedrails?: A Little Help needed moving to and from a bed to a chair (including a wheelchair)?: A Little Help needed standing up from a chair using your arms (e.g., wheelchair or bedside chair)?: A Little Help needed to walk in hospital room?: A Little Help needed climbing 3-5 steps with a railing? : A Little 6 Click Score: 18    End of Session Equipment Utilized During Treatment: Gait belt Activity Tolerance: Patient tolerated treatment well Patient left: in chair;with call bell/phone within reach;with family/visitor present Nurse Communication: Mobility status PT Visit Diagnosis: Other abnormalities of gait and mobility (R26.89);Muscle weakness (generalized) (M62.81);Difficulty in walking, not elsewhere classified (R26.2);Pain Pain - Right/Left: Right Pain - part of body: Hip    Time: 9629-5284 PT Time Calculation (min) (ACUTE ONLY): 43 min   Charges:   PT Evaluation $PT Eval Low Complexity: 1 Low PT Treatments $Gait Training: 8-22 mins $Therapeutic Exercise: 8-22 mins PT General Charges $$ ACUTE  PT VISIT: 1 Visit         Wynn Maudlin, DPT Acute Rehabilitation Services Office [phone]  10/03/23 2:48 PM

## 2023-10-03 NOTE — Discharge Instructions (Signed)

## 2023-10-03 NOTE — Anesthesia Postprocedure Evaluation (Signed)
Anesthesia Post Note  Patient: Kelsey Chavez  Procedure(s) Performed: RIGHT TOTAL HIP ARTHROPLASTY ANTERIOR APPROACH (Right: Hip)     Patient location during evaluation: PACU Anesthesia Type: MAC and Spinal Level of consciousness: awake Pain management: pain level controlled Vital Signs Assessment: post-procedure vital signs reviewed and stable Respiratory status: spontaneous breathing, respiratory function stable and nonlabored ventilation Cardiovascular status: blood pressure returned to baseline and stable Postop Assessment: no headache, no backache and no apparent nausea or vomiting Anesthetic complications: no   No notable events documented.  Last Vitals:  Vitals:   10/03/23 1115 10/03/23 1138  BP: 103/60 (!) 103/59  Pulse: 64 67  Resp: 10 18  Temp: 36.9 C 36.6 C  SpO2: 97% 100%    Last Pain:  Vitals:   10/03/23 1154  TempSrc:   PainSc: 10-Worst pain ever                 Linton Rump

## 2023-10-04 ENCOUNTER — Encounter (HOSPITAL_COMMUNITY): Payer: Self-pay | Admitting: Orthopaedic Surgery

## 2023-10-04 DIAGNOSIS — M1611 Unilateral primary osteoarthritis, right hip: Secondary | ICD-10-CM | POA: Diagnosis not present

## 2023-10-04 LAB — CBC
HCT: 28.7 % — ABNORMAL LOW (ref 36.0–46.0)
Hemoglobin: 9.3 g/dL — ABNORMAL LOW (ref 12.0–15.0)
MCH: 29.9 pg (ref 26.0–34.0)
MCHC: 32.4 g/dL (ref 30.0–36.0)
MCV: 92.3 fL (ref 80.0–100.0)
Platelets: 151 10*3/uL (ref 150–400)
RBC: 3.11 MIL/uL — ABNORMAL LOW (ref 3.87–5.11)
RDW: 13.2 % (ref 11.5–15.5)
WBC: 9.9 10*3/uL (ref 4.0–10.5)
nRBC: 0 % (ref 0.0–0.2)

## 2023-10-04 MED ORDER — SODIUM CHLORIDE 0.9 % IV BOLUS
500.0000 mL | Freq: Once | INTRAVENOUS | Status: AC
  Administered 2023-10-04: 500 mL via INTRAVENOUS

## 2023-10-04 NOTE — Progress Notes (Signed)
Subjective: 1 Day Post-Op Procedure(s) (LRB): RIGHT TOTAL HIP ARTHROPLASTY ANTERIOR APPROACH (Right) Patient reports pain as mild.    Objective: Vital signs in last 24 hours: Temp:  [97.9 F (36.6 C)-99.2 F (37.3 C)] 98.6 F (37 C) (11/20 0326) Pulse Rate:  [62-79] 74 (11/20 0326) Resp:  [10-20] 18 (11/20 0326) BP: (93-103)/(49-64) 93/51 (11/20 0326) SpO2:  [90 %-100 %] 98 % (11/20 0326)  Intake/Output from previous day: 11/19 0701 - 11/20 0700 In: 1620 [P.O.:720; I.V.:450; IV Piggyback:450] Out: 3525 [Urine:3225; Blood:300] Intake/Output this shift: No intake/output data recorded.  No results for input(s): "HGB" in the last 72 hours. No results for input(s): "WBC", "RBC", "HCT", "PLT" in the last 72 hours. No results for input(s): "NA", "K", "CL", "CO2", "BUN", "CREATININE", "GLUCOSE", "CALCIUM" in the last 72 hours. No results for input(s): "LABPT", "INR" in the last 72 hours.  Neurologically intact Neurovascular intact Sensation intact distally Intact pulses distally Dorsiflexion/Plantar flexion intact Incision: dressing C/D/I No cellulitis present Compartment soft   Assessment/Plan: 1 Day Post-Op Procedure(s) (LRB): RIGHT TOTAL HIP ARTHROPLASTY ANTERIOR APPROACH (Right) Advance diet Up with therapy Discharge home with home health once BP stable, is able to urinate on her own and is cleared by PT WBAT RLE Hypotensive- will reorder stat cbc (0500 am blood draw has not been done).  Will also order fluid bolus       Cristie Hem 10/04/2023, 7:56 AM

## 2023-10-04 NOTE — Plan of Care (Signed)
Pt doing well. Pt and daughter given D/C instructions with verbal understanding. Rx's were sent to the pharmacy by MD. Pt's incision is clean and dry with no sign of infection. Pt's IV was removed prior to D/C. Pt received RW from [employer] per MD order. Pt D/C'd home via wheelchair per MD order. Pt is stable @ D/C and has no other needs at this time. Rema Fendt, RN

## 2023-10-04 NOTE — Discharge Summary (Signed)
Patient ID: Kelsey Chavez MRN: [id number] DOB/AGE: 1966/06/24 57 y.o.  Admit date: 10/03/2023 Discharge date: 10/04/2023  Admission Diagnoses:  Principal Problem:   Primary osteoarthritis of right hip Active Problems:   Status post total replacement of right hip   Discharge Diagnoses:  Same  Past Medical History:  Diagnosis Date   Anxiety    Arthritis    Cancer (HCC) 2022   Right Breast   Depression    History of kidney stones    Pneumonia    Mild   Varicose veins of both lower extremities    Vertigo     Surgeries: Procedure(s): RIGHT TOTAL HIP ARTHROPLASTY ANTERIOR APPROACH on 10/03/2023   Consultants:   Discharged Condition: Improved  Hospital Course: Kelsey Chavez is an 57 y.o. female who was admitted 10/03/2023 for operative treatment ofPrimary osteoarthritis of right hip. Patient has severe unremitting pain that affects sleep, daily activities, and work/hobbies. After pre-op clearance the patient was taken to the operating room on 10/03/2023 and underwent  Procedure(s): RIGHT TOTAL HIP ARTHROPLASTY ANTERIOR APPROACH.    Patient was given perioperative antibiotics:  Anti-infectives (From admission, onward)    Start     Dose/Rate Route Frequency Ordered Stop   10/03/23 1345  ceFAZolin (ANCEF) IVPB 2g/100 mL premix        2 g 200 mL/hr over 30 Minutes Intravenous Every 6 hours 10/03/23 1120 10/03/23 2121   10/03/23 0812  vancomycin (VANCOCIN) powder  Status:  Discontinued          As needed 10/03/23 0812 10/03/23 0931   10/03/23 0600  ceFAZolin (ANCEF) IVPB 2g/100 mL premix        2 g 200 mL/hr over 30 Minutes Intravenous On call to O.R. 10/03/23 0546 10/03/23 0815        Patient was given sequential compression devices, early ambulation, and chemoprophylaxis to prevent DVT.  Patient benefited maximally from hospital stay and there were no complications.    Recent vital signs: Patient Vitals for the past 24 hrs:  BP Temp Temp src Pulse Resp SpO2   10/04/23 0758 (!) 94/51 98.5 F (36.9 C) Oral 69 16 98 %  10/04/23 0326 (!) 93/51 98.6 F (37 C) Oral 74 18 98 %  10/03/23 2336 (!) 93/53 98.8 F (37.1 C) Oral 69 18 100 %  10/03/23 2022 (!) 100/49 99.2 F (37.3 C) Oral 74 18 98 %  10/03/23 1558 (!) 101/55 98.1 F (36.7 C) Oral 62 20 100 %  10/03/23 1138 (!) 103/59 97.9 F (36.6 C) -- 67 18 100 %  10/03/23 1115 103/60 98.4 F (36.9 C) -- 64 10 97 %  10/03/23 1100 103/61 -- -- 65 11 98 %  10/03/23 1048 -- -- -- 64 11 98 %  10/03/23 1045 99/64 -- -- 70 15 90 %  10/03/23 1030 (!) 100/56 -- -- 63 11 95 %  10/03/23 1015 97/60 -- -- 65 13 98 %  10/03/23 1000 (!) 97/59 -- -- 71 10 98 %  10/03/23 0945 (!) 101/59 -- -- 75 12 99 %  10/03/23 0939 100/63 98.4 F (36.9 C) -- 79 20 98 %     Recent laboratory studies: No results for input(s): "WBC", "HGB", "HCT", "PLT", "NA", "K", "CL", "CO2", "BUN", "CREATININE", "GLUCOSE", "INR", "CALCIUM" in the last 72 hours.  Invalid input(s): "PT", "2"   Discharge Medications:   Allergies as of 10/04/2023       Reactions   Aspirin    Other reaction(s): Dizziness  Shrimp [shellfish Allergy] Itching        Medication List     STOP taking these medications    acetaminophen 650 MG CR tablet Commonly known as: TYLENOL       TAKE these medications    docusate sodium 100 MG capsule Commonly known as: Colace Take 1 capsule (100 mg total) by mouth daily as needed.   methocarbamol 750 MG tablet Commonly known as: Robaxin-750 Take 1 tablet (750 mg total) by mouth 2 (two) times daily as needed for muscle spasms.   ondansetron 4 MG tablet Commonly known as: Zofran Take 1 tablet (4 mg total) by mouth every 8 (eight) hours as needed for nausea or vomiting.   oxyCODONE-acetaminophen 5-325 MG tablet Commonly known as: Percocet Take 1-2 tablets by mouth every 6 (six) hours as needed.   rivaroxaban 10 MG Tabs tablet Commonly known as: XARELTO Take 1 tablet (10 mg total) by mouth daily.  To be taken after surgery to prevent blood clots   tamoxifen 20 MG tablet Commonly known as: NOLVADEX TAKE 1 TABLET BY MOUTH EVERY DAY   Vitamin D (Ergocalciferol) 1.25 MG (50000 UNIT) Caps capsule Commonly known as: DRISDOL Take 50,000 Units by mouth every Monday.               Durable Medical Equipment  (From admission, onward)           Start     Ordered   10/03/23 1121  DME Walker rolling  Once       Question:  Patient needs a walker to treat with the following condition  Answer:  History of hip replacement   10/03/23 1120   10/03/23 1121  DME 3 n 1  Once        10/03/23 1120   10/03/23 1121  DME Bedside commode  Once       Question:  Patient needs a bedside commode to treat with the following condition  Answer:  History of hip replacement   10/03/23 1120            Diagnostic Studies: DG Pelvis Portable  Result Date: 10/03/2023 CLINICAL DATA:  Postop right hip replacement. EXAM: PORTABLE PELVIS 1-2 VIEWS COMPARISON:  None Available. FINDINGS: Right hip arthroplasty in expected alignment. No periprosthetic lucency or fracture. Recent postsurgical change includes air and edema in the soft tissues. IMPRESSION: Right hip arthroplasty without immediate postoperative complication. Electronically Signed   By: Narda Rutherford M.D.   On: 10/03/2023 13:04   DG HIP UNILAT WITH PELVIS 1V RIGHT  Result Date: 10/03/2023 CLINICAL DATA:  [id number] Elective surgery [id number] EXAM: DG HIP (WITH OR WITHOUT PELVIS) 1V RIGHT COMPARISON:  None Available. FINDINGS: Three fluoroscopic spot views of the pelvis and right hip obtained in the operating room. Images during hip arthroplasty. Fluoroscopy time 30 seconds. Dose 3.74 mGy. IMPRESSION: Intraoperative fluoroscopy during right hip arthroplasty. Electronically Signed   By: Narda Rutherford M.D.   On: 10/03/2023 10:54   DG C-Arm 1-60 Min-No Report  Result Date: 10/03/2023 Fluoroscopy was utilized by the requesting physician.  No  radiographic interpretation.   DG C-Arm 1-60 Min-No Report  Result Date: 10/03/2023 Fluoroscopy was utilized by the requesting physician.  No radiographic interpretation.    Disposition: Discharge disposition: 01-Home or Self Care          Follow-up Information     Cristie Hem, PA-C. Schedule an appointment as soon as possible for a visit in 2 week(s).   Specialty: Orthopedic Surgery  Contact information: [address] [place] [address] [phone]         [facility] Follow up.   Why: [facility] will contact you for the first home visit Contact information: [address] [place] [address] [phone]                  Signed: Cristie Hem 10/04/2023, 8:00 AM

## 2023-10-04 NOTE — Progress Notes (Signed)
Physical Therapy Treatment Patient Details Name: Kelsey Chavez MRN: [id number] DOB: 1965/12/29 Today's Date: 10/04/2023   History of Present Illness Patient is 57 y.o. female s/p Rt THA anterior approach on 10/03/23. PMH significant for anxiety, depression, OA, Breast CA, Vertigo.    PT Comments  Patient resting in recliner at start of session and reports pain well controlled, agreeable to therapy visit. Pt completed sit<>stand transfers with supervision only, good carryover for hand placement with RW from prior visit. Pt ambulated ~120' with RW and good step through pattern, completed stair mobility with step to pattern and single rail to simulate home set up. No overt LOB throughout. EOS pt educated on HEP for hip ROM, strengthening, and circulation. She is mobilizing at safe level for return home with assist from family. Will continue to progress in acute setting, rec HHPT follow up.    If plan is discharge home, recommend the following: A little help with walking and/or transfers;A little help with bathing/dressing/bathroom;Assistance with cooking/housework;Help with stairs or ramp for entrance;Assist for transportation   Can travel by private vehicle        Equipment Recommendations  Rolling walker (2 wheels)    Recommendations for Other Services       Precautions / Restrictions Precautions Precautions: Fall Restrictions Weight Bearing Restrictions: No     Mobility  Bed Mobility               General bed mobility comments: pt OOB    Transfers Overall transfer level: Needs assistance Equipment used: Rolling walker (2 wheels) Transfers: Sit to/from Stand Sit to Stand: Supervision           General transfer comment: sup for safety, good carryover for safe hand placement    Ambulation/Gait Ambulation/Gait assistance: Supervision Gait Distance (Feet): 120 Feet Assistive device: Rolling walker (2 wheels) Gait Pattern/deviations: Step-through pattern,  Decreased stance time - right, Decreased stride length, Decreased weight shift to right Gait velocity: decr     General Gait Details: good carryover for step through pattern from yesterdays visit. no LOB noted and pt maintained safe position to RW throughout.   Stairs Stairs: Yes Stairs assistance: Contact guard assist Stair Management: One rail Right, Step to pattern, Sideways Number of Stairs: 4 General stair comments: cues for safe side step pattern with single rail, "up with good, down with bad" and no LOB noted. pt wtih steady safe pace. cues for guarding for pt's family to provide.   Wheelchair Mobility     Tilt Bed    Modified Rankin (Stroke Patients Only)       Balance Overall balance assessment: Needs assistance Sitting-balance support: Feet supported Sitting balance-Leahy Scale: Good     Standing balance support: Bilateral upper extremity supported, During functional activity, Reliant on assistive device for balance Standing balance-Leahy Scale: Poor Standing balance comment: reliant on RW                            Cognition Arousal: Alert Behavior During Therapy: WFL for tasks assessed/performed, Anxious Overall Cognitive Status: Within Functional Limits for tasks assessed                                          Exercises Total Joint Exercises Ankle Circles/Pumps: AROM, Left, 20 reps Quad Sets: AROM, Right, 5 reps Short Arc Quad: AROM, Right, 5 reps  Heel Slides: AAROM, Right, 5 reps Hip ABduction/ADduction: Right, 5 reps, AAROM Long Arc Quad: AROM, Right, 5 reps Knee Flexion: AROM, Right, 5 reps, Standing    General Comments        Pertinent Vitals/Pain Pain Assessment Pain Assessment: No/denies pain    Home Living                          Prior Function            PT Goals (current goals can now be found in the care plan section) Acute Rehab PT Goals Patient Stated Goal: dance again PT Goal  Formulation: With patient/family Time For Goal Achievement: 10/10/23 Potential to Achieve Goals: Good Progress towards PT goals: Progressing toward goals    Frequency    7X/week      PT Plan      Co-evaluation              AM-PAC PT "6 Clicks" Mobility   Outcome Measure  Help needed turning from your back to your side while in a flat bed without using bedrails?: A Little Help needed moving from lying on your back to sitting on the side of a flat bed without using bedrails?: A Little Help needed moving to and from a bed to a chair (including a wheelchair)?: A Little Help needed standing up from a chair using your arms (e.g., wheelchair or bedside chair)?: A Little Help needed to walk in hospital room?: A Little Help needed climbing 3-5 steps with a railing? : A Little 6 Click Score: 18    End of Session Equipment Utilized During Treatment: Gait belt Activity Tolerance: Patient tolerated treatment well Patient left: in chair;with call bell/phone within reach;with family/visitor present Nurse Communication: Mobility status PT Visit Diagnosis: Other abnormalities of gait and mobility (R26.89);Muscle weakness (generalized) (M62.81);Difficulty in walking, not elsewhere classified (R26.2);Pain Pain - Right/Left: Right Pain - part of body: Hip     Time: 1478-2956 PT Time Calculation (min) (ACUTE ONLY): 23 min  Charges:    $Gait Training: 8-22 mins $Therapeutic Exercise: 8-22 mins PT General Charges $$ ACUTE PT VISIT: 1 Visit                     Wynn Maudlin, DPT Acute Rehabilitation Services Office [phone]  10/04/23 1:13 PM

## 2023-10-05 ENCOUNTER — Telehealth: Payer: Self-pay | Admitting: *Deleted

## 2023-10-05 NOTE — Telephone Encounter (Signed)
Irving Burton from [facility] called to get VO for therapy plan of care. 2wk1,3wk1,2w1. Call back and gave VO for this plan of care.

## 2023-10-09 ENCOUNTER — Telehealth: Payer: Self-pay | Admitting: Orthopaedic Surgery

## 2023-10-09 MED ORDER — OXYCODONE-ACETAMINOPHEN 5-325 MG PO TABS: 1.0000 | ORAL_TABLET | Freq: Two times a day (BID) | ORAL | 0 refills | Status: DC | PRN

## 2023-10-09 NOTE — Telephone Encounter (Signed)
Pt's daughter Cala Bradford called fro refill of oxycodone. Please send to pharmacy on file. Pt phone number is [phone].

## 2023-10-09 NOTE — Telephone Encounter (Signed)
done

## 2023-10-18 ENCOUNTER — Ambulatory Visit: Payer: 59 | Admitting: Physician Assistant

## 2023-10-18 ENCOUNTER — Encounter: Payer: Self-pay | Admitting: Orthopaedic Surgery

## 2023-10-18 DIAGNOSIS — Z96641 Presence of right artificial hip joint: Secondary | ICD-10-CM

## 2023-10-18 MED ORDER — OXYCODONE-ACETAMINOPHEN 5-325 MG PO TABS: 1.0000 | ORAL_TABLET | Freq: Two times a day (BID) | ORAL | 0 refills | Status: DC | PRN

## 2023-10-18 NOTE — Addendum Note (Signed)
Addended by: Wendi Maya on: 10/18/2023 11:33 AM   Modules accepted: Orders

## 2023-10-18 NOTE — Addendum Note (Signed)
Addended by: Wendi Maya on: 10/18/2023 11:14 AM   Modules accepted: Orders

## 2023-10-18 NOTE — Progress Notes (Signed)
Post-Op Visit Note   Patient: Kelsey Chavez           Date of Birth: [birth year]           MRN: [id number] Visit Date: 10/18/2023 PCP: Barbette Reichmann, MD   Assessment & Plan:  Chief Complaint:  Chief Complaint  Patient presents with   Right Hip - Follow-up    Right total hip arthroplasty 10/03/2023   Visit Diagnoses:  1. Status post total replacement of right hip     Plan: Patient is a pleasant 57 year old female who comes in today 2 weeks status post right total hip replacement 10/03/2023.  She has been doing well.  She is taking oxycodone and Robaxin for pain.  She has been getting home health physical therapy and is ambulating with a walker.  She has been compliant taking Xarelto for DVT prophylaxis.  Examination of her right hip reveals a well healing surgical incision with nylon sutures in place.  No evidence of infection or cellulitis.  Calf is soft nontender.  She is neurovascularly intact distally.  Today, sutures were removed and Steri-Strips applied.  I refilled her oxycodone.  She would like to extend home health physical therapy for another few weeks.  Postop instructions provided.  Continue with her Xarelto until she is 4 weeks postop.  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  We have discussed reevaluating this at her follow-up appointment in 4 weeks where we will also obtain AP pelvis x-rays.  Call with concerns or questions in the meantime.  Follow-Up Instructions: Return in about 4 weeks (around 11/15/2023).   Orders:  No orders of the defined types were placed in this encounter.  Meds ordered this encounter  Medications   oxyCODONE-acetaminophen (PERCOCET) 5-325 MG tablet    Sig: Take 1-2 tablets by mouth 2 (two) times daily as needed.    Dispense:  24 tablet    Refill:  0    Imaging: No new imaging  PMFS History: Patient Active Problem List   Diagnosis Date Noted   Status post total replacement of right hip 10/03/2023   Primary  osteoarthritis of right hip 10/02/2023   Genetic testing 09/29/2021   Carcinoma of upper-outer quadrant of right breast in female, estrogen receptor positive (HCC) 03/04/2021   Major depressive disorder, recurrent, mild (HCC) 09/16/2019   Unintended weight gain 09/16/2019   Pain in limb 07/27/2018   OSA (obstructive sleep apnea) 04/15/2018   Intermittent vertigo 02/08/2018   Snoring 02/08/2018   Decreased sexual desire 08/02/2017   Swelling of limb 12/27/2016   Morbid obesity with body mass index of 40.0-49.9 (HCC) 12/02/2014   Asymptomatic varicose veins of both lower extremities 03/25/2014   Absence of bladder continence 03/25/2014   Past Medical History:  Diagnosis Date   Anxiety    Arthritis    Cancer (HCC) 2022   Right Breast   Depression    History of kidney stones    Pneumonia    Mild   Varicose veins of both lower extremities    Vertigo     Family History  Problem Relation Age of Onset   Varicose Veins Mother    Breast cancer Neg Hx     Past Surgical History:  Procedure Laterality Date   BREAST BIOPSY Right 02/24/2021   u/s bx "   BREAST LUMPECTOMY WITH SENTINEL LYMPH NODE BIOPSY Right 03/24/2021   Procedure: BREAST LUMPECTOMY WITH SENTINEL LYMPH NODE BX;  Surgeon: Earline Mayotte,  MD;  Location: [facility] ORS;  Service: General;  Laterality: Right;   CESAREAN SECTION  1996   CHOLECYSTECTOMY     COLONOSCOPY WITH PROPOFOL N/A 05/13/2019   Procedure: COLONOSCOPY WITH PROPOFOL;  Surgeon: Scot Jun, MD;  Location: [facility] END[facility]COPY;  Service: Endoscopy;  Laterality: N/A;   COLONOSCOPY WITH PROPOFOL N/A 09/25/2019   Procedure: COLONOSCOPY WITH PROPOFOL;  Surgeon: Toledo, Boykin Nearing, MD;  Location: [facility] ENDOSCOPY;  Service: Gastroenterology;  Laterality: N/A;   DIAGNOSTIC LAPAROSCOPY     TONSILLECTOMY  2014   TOTAL HIP ARTHROPLASTY Right 10/03/2023   Procedure: RIGHT TOTAL HIP ARTHROPLASTY ANTERIOR APPROACH;  Surgeon: Tarry Kos, MD;  Location: [facility] OR;  Service:  Orthopedics;  Laterality: Right;  3-C   TUBAL LIGATION  12/30/2015   VAGINAL DELIVERY  12/29/2005   VAGINAL DELIVERY  2000   Social History   Occupational History   Not on file  Tobacco Use   Smoking status: Never   Smokeless tobacco: Never  Vaping Use   Vaping status: Never Used  Substance and Sexual Activity   Alcohol use: Not Currently    Comment: Occasionally   Drug use: No   Sexual activity: Yes

## 2023-11-17 ENCOUNTER — Encounter: Payer: Self-pay | Admitting: Physician Assistant

## 2023-11-17 ENCOUNTER — Ambulatory Visit (INDEPENDENT_AMBULATORY_CARE_PROVIDER_SITE_OTHER): Payer: 59 | Admitting: Physician Assistant

## 2023-11-17 ENCOUNTER — Other Ambulatory Visit (INDEPENDENT_AMBULATORY_CARE_PROVIDER_SITE_OTHER): Payer: Self-pay

## 2023-11-17 DIAGNOSIS — Z96641 Presence of right artificial hip joint: Secondary | ICD-10-CM

## 2023-11-17 NOTE — Progress Notes (Signed)
 Post-Op Visit Note   Patient: Kelsey Chavez           Date of Birth: 30-Mar-1966           MRN: [id number] Visit Date: 11/17/2023 PCP: Sadie Manna, MD   Assessment & Plan:  Chief Complaint:  Chief Complaint  Patient presents with   Right Hip - Follow-up    Right total hip arthroplasty 10/03/2023   Visit Diagnoses:  1. Status post total replacement of right hip     Plan: Patient is a pleasant 58 year old female who comes in today 6 weeks status post right total hip replacement 10/03/2023.  She has been doing well.  She is not taking anything for pain however she does note discomfort at night and with hip flexion.  She also has paresthesias to the right lateral hip..  She is ambulating with a cane.  She has finished her Xarelto .  Examination of the right hip reveals weakness with hip flexion.  No pain.  She is neurovascularly intact distally.  At this point, we have discussed starting her in a short course of outpatient physical therapy.  Hardcopy prescription has been provided as there is a place in [place] they would like to attend.  Dental prophylaxis reinforced.  Follow-up in 6 weeks for recheck.  Follow-Up Instructions: Return in about 6 weeks (around 12/29/2023).   Orders:  Orders Placed This Encounter  Procedures   XR Pelvis 1-2 Views   No orders of the defined types were placed in this encounter.   Imaging: XR Pelvis 1-2 Views Result Date: 11/17/2023 Well-seated prosthesis without complication   PMFS History: Patient Active Problem List   Diagnosis Date Noted   Status post total replacement of right hip 10/03/2023   Primary osteoarthritis of right hip 10/02/2023   Genetic testing 09/29/2021   Carcinoma of upper-outer quadrant of right breast in female, estrogen receptor positive (HCC) 03/04/2021   Major depressive disorder, recurrent, mild (HCC) 09/16/2019   Unintended weight gain 09/16/2019   Pain in limb 07/27/2018   OSA (obstructive sleep apnea)  04/15/2018   Intermittent vertigo 02/08/2018   Snoring 02/08/2018   Decreased sexual desire 08/02/2017   Swelling of limb 12/27/2016   Morbid obesity with body mass index of 40.0-49.9 (HCC) 12/02/2014   Asymptomatic varicose veins of both lower extremities 03/25/2014   Absence of bladder continence 03/25/2014   Past Medical History:  Diagnosis Date   Anxiety    Arthritis    Cancer (HCC) 2022   Right Breast   Depression    History of kidney stones    Pneumonia    Mild   Varicose veins of both lower extremities    Vertigo     Family History  Problem Relation Age of Onset   Varicose Veins Mother    Breast cancer Neg Hx     Past Surgical History:  Procedure Laterality Date   BREAST BIOPSY Right 02/24/2021   u/s bx    BREAST LUMPECTOMY WITH SENTINEL LYMPH NODE BIOPSY Right 03/24/2021   Procedure: BREAST LUMPECTOMY WITH SENTINEL LYMPH NODE BX;  Surgeon: Dessa Reyes ORN, MD;  Location: [facility] ORS;  Service: General;  Laterality: Right;   CESAREAN SECTION  1996   CHOLECYSTECTOMY     COLONOSCOPY WITH PROPOFOL  N/A 05/13/2019   Procedure: COLONOSCOPY WITH PROPOFOL ;  Surgeon: Viktoria Lamar DASEN, MD;  Location: [facility] ENDOSCOPY;  Service: Endoscopy;  Laterality: N/A;   COLONOSCOPY WITH PROPOFOL  N/A 09/25/2019   Procedure: COLONOSCOPY WITH PROPOFOL ;  Surgeon:  Toledo, Ladell POUR, MD;  Location: [facility] ENDOSCOPY;  Service: Gastroenterology;  Laterality: N/A;   DIAGNOSTIC LAPAROSCOPY     TONSILLECTOMY  2014   TOTAL HIP ARTHROPLASTY Right 10/03/2023   Procedure: RIGHT TOTAL HIP ARTHROPLASTY ANTERIOR APPROACH;  Surgeon: Jerri Kay HERO, MD;  Location: [facility] OR;  Service: Orthopedics;  Laterality: Right;  3-C   TUBAL LIGATION  12/30/2015   VAGINAL DELIVERY  12/29/2005   VAGINAL DELIVERY  2000   Social History   Occupational History   Not on file  Tobacco Use   Smoking status: Never   Smokeless tobacco: Never  Vaping Use   Vaping status: Never Used  Substance and Sexual Activity   Alcohol  use: Not Currently    Comment: Occasionally   Drug use: No   Sexual activity: Yes

## 2023-11-20 ENCOUNTER — Telehealth: Payer: Self-pay | Admitting: Orthopaedic Surgery

## 2023-11-20 NOTE — Telephone Encounter (Signed)
 That's fine.  We can do that until her follow up appt on 2/13

## 2023-11-20 NOTE — Telephone Encounter (Signed)
 Pt's daughter called requesting a return letter for pt to return to work 2 weeks from today's date. Also asking that letter states she can work only 2 to 3 days and light duty. Please call pt's daughter when ready for pick up. At [phone].

## 2023-11-21 NOTE — Telephone Encounter (Signed)
 Created letter. Notified Cala Bradford and sent to [employer] as requested.

## 2023-11-23 ENCOUNTER — Telehealth: Payer: Self-pay | Admitting: Physician Assistant

## 2023-11-23 NOTE — Telephone Encounter (Signed)
 2 days ago she ask for a note stating... Kelsey Chavez is able to return to work on 12/04/2023. She can work 2-3 days per week, light duty.   Please advise.

## 2023-11-23 NOTE — Telephone Encounter (Signed)
 Spoke with patient. Sent letter to [employer] as requested.

## 2023-11-23 NOTE — Telephone Encounter (Signed)
 Full release is fine.

## 2023-11-23 NOTE — Telephone Encounter (Signed)
 Patient called and ask if she can get a full release back to work and if so can she have a note saying that for her job. CB#[phone]

## 2023-12-13 ENCOUNTER — Inpatient Hospital Stay: Payer: Medicaid Other

## 2023-12-13 ENCOUNTER — Inpatient Hospital Stay: Payer: Medicaid Other | Admitting: Internal Medicine

## 2023-12-14 ENCOUNTER — Ambulatory Visit: Payer: 59 | Admitting: Physician Assistant

## 2023-12-22 ENCOUNTER — Encounter: Payer: Self-pay | Admitting: Internal Medicine

## 2023-12-22 ENCOUNTER — Inpatient Hospital Stay: Payer: Medicaid Other | Attending: Internal Medicine

## 2023-12-22 ENCOUNTER — Inpatient Hospital Stay (HOSPITAL_BASED_OUTPATIENT_CLINIC_OR_DEPARTMENT_OTHER): Payer: Medicaid Other | Admitting: Internal Medicine

## 2023-12-22 VITALS — BP 111/68 | HR 66 | Temp 97.8°F | Resp 19 | Wt 185.2 lb

## 2023-12-22 DIAGNOSIS — N951 Menopausal and female climacteric states: Secondary | ICD-10-CM | POA: Diagnosis not present

## 2023-12-22 DIAGNOSIS — C50411 Malignant neoplasm of upper-outer quadrant of right female breast: Secondary | ICD-10-CM

## 2023-12-22 DIAGNOSIS — R232 Flushing: Secondary | ICD-10-CM | POA: Insufficient documentation

## 2023-12-22 DIAGNOSIS — Z170 Estrogen receptor positive status [ER+]: Secondary | ICD-10-CM | POA: Insufficient documentation

## 2023-12-22 DIAGNOSIS — K760 Fatty (change of) liver, not elsewhere classified: Secondary | ICD-10-CM | POA: Diagnosis not present

## 2023-12-22 DIAGNOSIS — M25551 Pain in right hip: Secondary | ICD-10-CM | POA: Insufficient documentation

## 2023-12-22 DIAGNOSIS — Z79810 Long term (current) use of selective estrogen receptor modulators (SERMs): Secondary | ICD-10-CM | POA: Insufficient documentation

## 2023-12-22 LAB — CMP (CANCER CENTER ONLY)
ALT: 75 U/L — ABNORMAL HIGH (ref 0–44)
AST: 76 U/L — ABNORMAL HIGH (ref 15–41)
Albumin: 3.8 g/dL (ref 3.5–5.0)
Alkaline Phosphatase: 92 U/L (ref 38–126)
Anion gap: 5 (ref 5–15)
BUN: 15 mg/dL (ref 6–20)
CO2: 27 mmol/L (ref 22–32)
Calcium: 8.7 mg/dL — ABNORMAL LOW (ref 8.9–10.3)
Chloride: 105 mmol/L (ref 98–111)
Creatinine: 0.53 mg/dL (ref 0.44–1.00)
GFR, Estimated: >60 mL/min (ref >60)
Glucose, Bld: 106 mg/dL — ABNORMAL HIGH (ref 70–99)
Potassium: 3.8 mmol/L (ref 3.5–5.1)
Sodium: 137 mmol/L (ref 135–145)
Total Bilirubin: 0.3 mg/dL (ref 0.0–1.2)
Total Protein: 7.1 g/dL (ref 6.5–8.1)

## 2023-12-22 LAB — CBC WITH DIFFERENTIAL (CANCER CENTER ONLY)
Abs Immature Granulocytes: 0.01 10*3/uL (ref 0.00–0.07)
Basophils Absolute: 0 10*3/uL (ref 0.0–0.1)
Basophils Relative: 0 %
Eosinophils Absolute: 0.1 10*3/uL (ref 0.0–0.5)
Eosinophils Relative: 3 %
HCT: 36.1 % (ref 36.0–46.0)
Hemoglobin: 11.7 g/dL — ABNORMAL LOW (ref 12.0–15.0)
Immature Granulocytes: 0 %
Lymphocytes Relative: 45 %
Lymphs Abs: 1.6 10*3/uL (ref 0.7–4.0)
MCH: 29.8 pg (ref 26.0–34.0)
MCHC: 32.4 g/dL (ref 30.0–36.0)
MCV: 92.1 fL (ref 80.0–100.0)
Monocytes Absolute: 0.2 10*3/uL (ref 0.1–1.0)
Monocytes Relative: 6 %
Neutro Abs: 1.7 10*3/uL (ref 1.7–7.7)
Neutrophils Relative %: 46 %
Platelet Count: 218 10*3/uL (ref 150–400)
RBC: 3.92 MIL/uL (ref 3.87–5.11)
RDW: 12.4 % (ref 11.5–15.5)
WBC Count: 3.6 10*3/uL — ABNORMAL LOW (ref 4.0–10.5)
nRBC: 0 % (ref 0.0–0.2)

## 2023-12-22 LAB — VITAMIN D 25 HYDROXY (VIT D DEFICIENCY, FRACTURES): Vit D, 25-Hydroxy: 33.46 ng/mL (ref 30–100)

## 2023-12-22 MED ORDER — TAMOXIFEN CITRATE 20 MG PO TABS: 20.0000 mg | ORAL_TABLET | Freq: Every day | ORAL | 1 refills | Status: DC

## 2023-12-22 NOTE — Progress Notes (Signed)
 [facility] CONSULT NOTE  Patient Care Team: Sadie Manna, MD as PCP - General (Internal Medicine) Cindie Jesusa HERO, RN as Oncology Nurse Navigator Rennie Cindy SAUNDERS, MD as Consulting Physician (Internal Medicine) Lenn Aran, MD as Consulting Physician (Radiation Oncology) Dessa Reyes ORN, MD as Consulting Physician (General Surgery)  CHIEF COMPLAINTS/PURPOSE OF CONSULTATION: Breast cancer  #  Oncology History Overview Note  # RIGHT BREAST CA- cT1b [52mm]c N0; ER/PR- POSITIVE  [>90%]; her 2 NEG; GRADE-1 [Dr.Byrnett]; status postlumpectomy-grade 1; tumor size 7 mm; no LVI; clear margins-no Oncotype recommended.s/p RT.  # SEP 1st week, 2022-start tamoxifen  [perimenopausal]; wean off Wellbutrin; OCT 2022- celexa                         # SURVIVORSHIP:   # GENETICS:   DIAGNOSIS:   STAGE:         ;  GOALS:  CURRENT/MOST RECENT THERAPY :       Carcinoma of upper-outer quadrant of right breast in female, estrogen receptor positive (HCC)  03/04/2021 Initial Diagnosis   Carcinoma of upper-outer quadrant of right breast in female, estrogen receptor positive (HCC)   03/04/2021 Cancer Staging   Staging form: Breast, AJCC 8th Edition - Clinical: Stage IA (cT1b, cN0, cM0, G1, ER+, PR+, HER2-) - Signed by Rennie Cindy SAUNDERS, MD on 03/04/2021 Histologic grading system: 3 grade system    Genetic Testing   Negative genetic testing. No pathogenic variants identified on the [employer] CustomNext+RNA panel. The report date is 09/27/2021.  The CustomNext-Cancer + RNAinsight panel  includes sequencing and/or deletion duplication testing of the following 47 genes: APC, ATM, AXIN2, BARD1, BMPR1A, BRCA1, BRCA2, BRIP1, CDH1, CDKN2A (p14ARF), CDKN2A (p16INK4a), CKD4, CHEK2, CTNNA1, DICER1, EPCAM (Deletion/duplication testing only), GREM1 (promoter region deletion/duplication testing only), KIT, MEN1, MLH1, MSH2, MSH3, MSH6, MUTYH, NBN, NF1, NHTL1, PALB2, PDGFRA, PMS2, POLD1,  POLE, PTEN, RAD50, RAD51C, RAD51D, SDHB, SDHC, SDHD, SMAD4, SMARCA4. STK11, TP53, TSC1, TSC2, and VHL.  The following genes were evaluated for sequence changes only: SDHA and HOXB13 c.251G>A variant only.    HISTORY OF PRESENTING ILLNESS: with daughter.  Ambulating independently.  Kenneth SHAUNNA Agreste 58 y.o.  female patient with recent history of stage I ER/PR positive HER2 negative breast cancer-currently tamoxifen  is here for follow-up.  Patient complians of worsening of right pain s/p Hip replacement. Currently in out patient rehab.  Otherwise, Doing well. Denies any breast discomfort. Taking tamoxifen , does have hot flashes in the day time. Patient denies any worsening hot flashes.  Energy is fairly good. Working full time.   Review of Systems  Constitutional:  Negative for chills, diaphoresis, fever, malaise/fatigue and weight loss.  HENT:  Negative for nosebleeds and sore throat.   Eyes:  Negative for double vision.  Respiratory:  Negative for cough, hemoptysis, sputum production, shortness of breath and wheezing.   Cardiovascular:  Negative for chest pain, palpitations, orthopnea and leg swelling.  Gastrointestinal:  Negative for abdominal pain, blood in stool, constipation, diarrhea, heartburn, melena, nausea and vomiting.  Genitourinary:  Negative for dysuria, frequency and urgency.  Musculoskeletal:  Negative for back pain and joint pain.  Skin: Negative.  Negative for itching and rash.  Neurological:  Negative for dizziness, tingling, focal weakness, weakness and headaches.  Endo/Heme/Allergies:  Does not bruise/bleed easily.  Psychiatric/Behavioral:  Negative for depression. The patient is not nervous/anxious and does not have insomnia.      MEDICAL HISTORY:  Past Medical History:  Diagnosis Date   Anxiety  Arthritis    Cancer (HCC) 2022   Right Breast   Depression    History of kidney stones    Pneumonia    Mild   Varicose veins of both lower extremities    Vertigo      SURGICAL HISTORY: Past Surgical History:  Procedure Laterality Date   BREAST BIOPSY Right 02/24/2021   u/s bx    BREAST LUMPECTOMY WITH SENTINEL LYMPH NODE BIOPSY Right 03/24/2021   Procedure: BREAST LUMPECTOMY WITH SENTINEL LYMPH NODE BX;  Surgeon: Dessa Reyes ORN, MD;  Location: [facility] ORS;  Service: General;  Laterality: Right;   CESAREAN SECTION  1996   CHOLECYSTECTOMY     COLONOSCOPY WITH PROPOFOL  N/A 05/13/2019   Procedure: COLONOSCOPY WITH PROPOFOL ;  Surgeon: Viktoria Lamar DASEN, MD;  Location: [facility] ENDOSCOPY;  Service: Endoscopy;  Laterality: N/A;   COLONOSCOPY WITH PROPOFOL  N/A 09/25/2019   Procedure: COLONOSCOPY WITH PROPOFOL ;  Surgeon: Toledo, Ladell POUR, MD;  Location: [facility] ENDOSCOPY;  Service: Gastroenterology;  Laterality: N/A;   DIAGNOSTIC LAPAROSCOPY     TONSILLECTOMY  2014   TOTAL HIP ARTHROPLASTY Right 10/03/2023   Procedure: RIGHT TOTAL HIP ARTHROPLASTY ANTERIOR APPROACH;  Surgeon: Jerri Kay HERO, MD;  Location: [facility] OR;  Service: Orthopedics;  Laterality: Right;  3-C   TUBAL LIGATION  12/30/2015   VAGINAL DELIVERY  12/29/2005   VAGINAL DELIVERY  2000    SOCIAL HISTORY: Social History   Socioeconomic History   Marital status: Legally Separated    Spouse name: Not on file   Number of children: Not on file   Years of education: Not on file   Highest education level: Not on file  Occupational History   Not on file  Tobacco Use   Smoking status: Never   Smokeless tobacco: Never  Vaping Use   Vaping status: Never Used  Substance and Sexual Activity   Alcohol use: Not Currently    Comment: Occasionally   Drug use: No   Sexual activity: Yes  Other Topics Concern   Not on file  Social History Narrative   Works at [employer]; lives in [place] with daughters; never smoked; no alcohol.   Social Drivers of Corporate Investment Banker Strain: Low Risk  (12/22/2023)   Received [facility]   Overall Financial Resource Strain (CARDIA)     Difficulty of Paying Living Expenses: Not hard at all  Food Insecurity: No Food Insecurity (12/22/2023)   Received from [facility]   Hunger Vital Sign    Worried About Running Out of Food in the Last Year: Never true    Ran Out of Food in the Last Year: Never true  Transportation Needs: No Transportation Needs (12/22/2023)   Received from [facility] - Transportation    In the past 12 months, has lack of transportation kept you from medical appointments or from getting medications?: No    Lack of Transportation (Non-Medical): No  Physical Activity: Not on file  Stress: Not on file  Social Connections: Not on file  Intimate Partner Violence: Not on file    FAMILY HISTORY: Family History  Problem Relation Age of Onset   Varicose Veins Mother    Breast cancer Neg Hx     ALLERGIES:  is allergic to aspirin and shrimp [shellfish allergy].  MEDICATIONS:  Current Outpatient Medications  Medication Sig Dispense Refill   docusate sodium  (COLACE) 100 MG capsule Take 1 capsule (100 mg total) by mouth daily as  needed. 30 capsule 2   methocarbamol  (ROBAXIN -750) 750 MG tablet Take 1 tablet (750 mg total) by mouth 2 (two) times daily as needed for muscle spasms. 20 tablet 2   oxyCODONE -acetaminophen  (PERCOCET) 5-325 MG tablet Take 1-2 tablets by mouth 2 (two) times daily as needed. 24 tablet 0   rivaroxaban  (XARELTO ) 10 MG TABS tablet Take 1 tablet (10 mg total) by mouth daily. To be taken after surgery to prevent blood clots 30 tablet 0   Vitamin D , Ergocalciferol , (DRISDOL) 1.25 MG (50000 UNIT) CAPS capsule Take 50,000 Units by mouth every Monday.     ondansetron  (ZOFRAN ) 4 MG tablet Take 1 tablet (4 mg total) by mouth every 8 (eight) hours as needed for nausea or vomiting. 40 tablet 0   tamoxifen  (NOLVADEX ) 20 MG tablet Take 1 tablet (20 mg total) by mouth daily. 90 tablet 1   No current facility-administered medications for this visit.       SABRA  PHYSICAL EXAMINATION: ECOG PERFORMANCE STATUS: 0 - Asymptomatic  Vitals:   12/22/23 1315  BP: 111/68  Pulse: 66  Resp: 19  Temp: 97.8 F (36.6 C)  SpO2: 97%     Filed Weights   12/22/23 1315  Weight: 185 lb 3.2 oz (84 kg)      Physical Exam HENT:     Head: Normocephalic and atraumatic.     Mouth/Throat:     Pharynx: No oropharyngeal exudate.  Eyes:     Pupils: Pupils are equal, round, and reactive to light.  Cardiovascular:     Rate and Rhythm: Normal rate and regular rhythm.  Pulmonary:     Effort: Pulmonary effort is normal. No respiratory distress.     Breath sounds: Normal breath sounds. No wheezing.  Abdominal:     General: Bowel sounds are normal. There is no distension.     Palpations: Abdomen is soft. There is no mass.     Tenderness: There is no abdominal tenderness. There is no guarding or rebound.  Musculoskeletal:        General: No tenderness. Normal range of motion.     Cervical back: Normal range of motion and neck supple.  Skin:    General: Skin is warm.  Neurological:     Mental Status: She is alert and oriented to person, place, and time.  Psychiatric:        Mood and Affect: Affect normal.      LABORATORY DATA:  I have reviewed the data as listed Lab Results  Component Value Date   WBC 3.6 (L) 12/22/2023   HGB 11.7 (L) 12/22/2023   HCT 36.1 12/22/2023   MCV 92.1 12/22/2023   PLT 218 12/22/2023   Recent Labs    06/14/23 1410 09/27/23 0906 12/22/23 1255  NA 136 139 137  K 4.1 4.1 3.8  CL 106 107 105  CO2 23 25 27   GLUCOSE 97 109* 106*  BUN 17 10 15   CREATININE 0.56 0.52 0.53  CALCIUM 8.9 9.1 8.7*  GFRNONAA >60 >60 >60  PROT 7.6  --  7.1  ALBUMIN  3.9  --  3.8  AST 75*  --  76*  ALT 87*  --  75*  ALKPHOS 117  --  92  BILITOT 0.4  --  0.3    RADIOGRAPHIC STUDIES: I have personally reviewed the radiological images as listed and agreed with the findings in the report. No results found.   ASSESSMENT & PLAN:    Carcinoma of upper-outer quadrant of right breast  in female, estrogen receptor positive (HCC) #Breast cancer stage I ER/PR positive HER2 negative] postlumpectomy[Dr.Byrnett].   T1b; grade 1; no LVI] [low risk; NO oncotype; no chemo]. currently on Tamoxifen  [started sep 1st, 2022 x 5 year]; [id number]. [CC] APRIL 2024-BIL mammogram- WNL   # No evidence of any recurrence.  Continue tamoxifen  tolerating well except for mild hot flashes. Refilled Tamoxifen   # MSK- G-1-2; Right hip pain- s/p  THR - KC- stable/improved.   # fatty liver- mild elevation of AST/ALT- recommend cutting down carbs- stable.   #History of depression- not taking Celexa  20 mg/day-insomnia-not any worse. Stable  # Bone health- will order BMD with mammogram; LOW vit D- [as per PCP]- on vit D ergo-; consider CARE program referral at next visit.   # DISPOSITION:.  # BMD& mammogram in APRIL 2025.  # follow up in 6 months- MD; NO labs---Dr.B   All questions were answered. The patient/family knows to call the clinic with any problems, questions or concerns.    Cindy JONELLE Joe, MD 12/22/2023 2:51 PM

## 2023-12-22 NOTE — Assessment & Plan Note (Addendum)
#  Breast cancer stage I ER/PR positive HER2 negative] postlumpectomy[Dr.Byrnett].   T1b; grade 1; no LVI] [low risk; NO oncotype; no chemo]. currently on Tamoxifen  [started sep 1st, 2022 x 5 year]; [id number]. [CC] APRIL 2024-BIL mammogram- WNL   # No evidence of any recurrence.  Continue tamoxifen  tolerating well except for mild hot flashes. Refilled Tamoxifen   # MSK- G-1-2; Right hip pain- s/p  THR - KC- stable/improved.   # fatty liver- mild elevation of AST/ALT- recommend cutting down carbs- stable.   #History of depression- not taking Celexa  20 mg/day-insomnia-not any worse. Stable  # Bone health- will order BMD with mammogram; LOW vit D- [as per PCP]- on vit D ergo-; consider CARE program referral at next visit.   # DISPOSITION:.  # BMD& mammogram in APRIL 2025.  # follow up in 6 months- MD; NO labs---Dr.B

## 2023-12-22 NOTE — Progress Notes (Signed)
 Patient has no concerns

## 2023-12-28 ENCOUNTER — Ambulatory Visit: Payer: 59 | Admitting: Physician Assistant

## 2024-01-02 ENCOUNTER — Ambulatory Visit: Payer: 59 | Admitting: Physician Assistant

## 2024-01-03 ENCOUNTER — Encounter: Payer: Self-pay | Admitting: Orthopaedic Surgery

## 2024-01-03 ENCOUNTER — Ambulatory Visit (INDEPENDENT_AMBULATORY_CARE_PROVIDER_SITE_OTHER): Payer: Medicaid Other | Admitting: Physician Assistant

## 2024-01-03 DIAGNOSIS — Z96641 Presence of right artificial hip joint: Secondary | ICD-10-CM

## 2024-01-03 NOTE — Progress Notes (Signed)
 Post-Op Visit Note   Patient: Kelsey Chavez           Date of Birth: 14-Nov-1966           MRN: [id number] Visit Date: 01/03/2024 PCP: Barbette Reichmann, MD   Assessment & Plan:  Chief Complaint:  Chief Complaint  Patient presents with   Right Hip - Follow-up    Right total hip arthroplasty 10/03/2023   Visit Diagnoses:  1. Status post total replacement of right hip     Plan: Patient is a pleasant 58 year old female who comes in today 3 months status post right total hip replacement 10/03/2023.  She has been doing well.  She is not in any pain.  She has been in physical therapy working on strengthening exercises as she is still having some difficulty getting in and out of the car.  Examination of the right hip reveals minimal discomfort with hip flexion.  Still some limitation there as well.  No pain with logroll.  She is neurovascularly intact distally.  At this point, she will continue working on strengthening exercises.  Dental prophylaxis reinforced.  Follow-up in 3 months for repeat evaluation and AP pelvis x-rays.  Call with concerns or questions.  Follow-Up Instructions: Return in about 3 months (around 04/01/2024).   Orders:  No orders of the defined types were placed in this encounter.  No orders of the defined types were placed in this encounter.   Imaging: No new imaging  PMFS History: Patient Active Problem List   Diagnosis Date Noted   Status post total replacement of right hip 10/03/2023   Primary osteoarthritis of right hip 10/02/2023   Genetic testing 09/29/2021   Carcinoma of upper-outer quadrant of right breast in female, estrogen receptor positive (HCC) 03/04/2021   Major depressive disorder, recurrent, mild (HCC) 09/16/2019   Unintended weight gain 09/16/2019   Pain in limb 07/27/2018   OSA (obstructive sleep apnea) 04/15/2018   Intermittent vertigo 02/08/2018   Snoring 02/08/2018   Decreased sexual desire 08/02/2017   Swelling of limb  12/27/2016   Morbid obesity with body mass index of 40.0-49.9 (HCC) 12/02/2014   Asymptomatic varicose veins of both lower extremities 03/25/2014   Absence of bladder continence 03/25/2014   Past Medical History:  Diagnosis Date   Anxiety    Arthritis    Cancer (HCC) 2022   Right Breast   Depression    History of kidney stones    Pneumonia    Mild   Varicose veins of both lower extremities    Vertigo     Family History  Problem Relation Age of Onset   Varicose Veins Mother    Breast cancer Neg Hx     Past Surgical History:  Procedure Laterality Date   BREAST BIOPSY Right 02/24/2021   u/s bx "   BREAST LUMPECTOMY WITH SENTINEL LYMPH NODE BIOPSY Right 03/24/2021   Procedure: BREAST LUMPECTOMY WITH SENTINEL LYMPH NODE BX;  Surgeon: Earline Mayotte, MD;  Location: [facility] ORS;  Service: General;  Laterality: Right;   CESAREAN SECTION  1996   CHOLECYSTECTOMY     COLONOSCOPY WITH PROPOFOL N/A 05/13/2019   Procedure: COLONOSCOPY WITH PROPOFOL;  Surgeon: Scot Jun, MD;  Location: [facility] ENDOSCOPY;  Service: Endoscopy;  Laterality: N/A;   COLONOSCOPY WITH PROPOFOL N/A 09/25/2019   Procedure: COLONOSCOPY WITH PROPOFOL;  Surgeon: Toledo, Boykin Nearing, MD;  Location: [facility] ENDOSCOPY;  Service: Gastroenterology;  Laterality: N/A;   DIAGNOSTIC LAPAROSCOPY     TONSILLECTOMY  2014   TOTAL HIP ARTHROPLASTY Right 10/03/2023   Procedure: RIGHT TOTAL HIP ARTHROPLASTY ANTERIOR APPROACH;  Surgeon: Tarry Kos, MD;  Location: [facility] OR;  Service: Orthopedics;  Laterality: Right;  3-C   TUBAL LIGATION  12/30/2015   VAGINAL DELIVERY  12/29/2005   VAGINAL DELIVERY  2000   Social History   Occupational History   Not on file  Tobacco Use   Smoking status: Never   Smokeless tobacco: Never  Vaping Use   Vaping status: Never Used  Substance and Sexual Activity   Alcohol use: Not Currently    Comment: Occasionally   Drug use: No   Sexual activity: Yes

## 2024-02-20 ENCOUNTER — Other Ambulatory Visit: Payer: Medicaid Other

## 2024-02-28 ENCOUNTER — Telehealth: Payer: Self-pay | Admitting: *Deleted

## 2024-02-28 NOTE — Telephone Encounter (Signed)
 Call about needing info for health plan and the telephone is [phone] and ref: [id number]. She will fax the request today and it is should be done today but we will get it as we can. I gave fax number [phone]

## 2024-03-16 ENCOUNTER — Other Ambulatory Visit: Payer: Self-pay | Admitting: Physician Assistant

## 2024-03-20 ENCOUNTER — Encounter (HOSPITAL_COMMUNITY): Payer: Self-pay

## 2024-03-27 ENCOUNTER — Ambulatory Visit
Admission: RE | Admit: 2024-03-27 | Discharge: 2024-03-27 | Disposition: A | Discharge status: Home / Self Care | Source: Ambulatory Visit | Attending: Internal Medicine | Admitting: Internal Medicine

## 2024-03-27 ENCOUNTER — Other Ambulatory Visit: Payer: Self-pay | Admitting: Internal Medicine

## 2024-03-27 DIAGNOSIS — C50411 Malignant neoplasm of upper-outer quadrant of right female breast: Secondary | ICD-10-CM | POA: Insufficient documentation

## 2024-03-27 DIAGNOSIS — Z1231 Encounter for screening mammogram for malignant neoplasm of breast: Secondary | ICD-10-CM | POA: Insufficient documentation

## 2024-03-27 DIAGNOSIS — Z13820 Encounter for screening for osteoporosis: Secondary | ICD-10-CM | POA: Insufficient documentation

## 2024-03-27 DIAGNOSIS — M8589 Other specified disorders of bone density and structure, multiple sites: Secondary | ICD-10-CM | POA: Insufficient documentation

## 2024-03-27 DIAGNOSIS — Z170 Estrogen receptor positive status [ER+]: Secondary | ICD-10-CM | POA: Diagnosis present

## 2024-04-03 ENCOUNTER — Other Ambulatory Visit (INDEPENDENT_AMBULATORY_CARE_PROVIDER_SITE_OTHER)

## 2024-04-03 ENCOUNTER — Ambulatory Visit: Admitting: Orthopaedic Surgery

## 2024-04-03 DIAGNOSIS — Z96641 Presence of right artificial hip joint: Secondary | ICD-10-CM

## 2024-04-03 NOTE — Progress Notes (Signed)
 Office Visit Note   Patient: Kelsey Chavez           Date of Birth: [birth year]           MRN: [id number] Visit Date: 04/03/2024              Requested by: Antonio Baumgarten, MD [address] [facility] [place],  [place] [address] PCP: Antonio Baumgarten, MD   Assessment & Plan: Visit Diagnoses:  1. Status post total replacement of right hip     Plan: History of Present Illness Kelsey Chavez is a 58 year old female who presents for 6 month follow-up visit post right hip replacement.  She is six months post right hip replacement and has returned to her normal activities with significant improvement. She is satisfied with the surgical outcome.  She experiences pain in the left hip. An MRI from last year showed mild arthritis in the left hip.  She has arthritis symptoms in her fingers, causing pain and cramping, especially when lifting weights. She uses medications and Voltaren gel for management and seeks advice on exercises to alleviate these symptoms.  Assessment and Plan Right hip replacement Six months post-surgery with significant improvement and normal activity resumption. X-rays confirm well-positioned prosthesis. - Advise prophylactic antibiotics before dental procedures for two years post-surgery.  Osteoarthritis of left hip Mild pain with MRI showing mild arthritis. Further intervention deferred until symptom progression.  Osteoarthritis of fingers Pain and cramping during weight-bearing activities. Management focuses on symptom relief. - Recommend medications and Voltaren gel for symptom relief. - Advise exercises to improve finger mobility.  Follow-Up Instructions: Return in about 6 months (around 10/04/2024) for with lindsey.   Orders:  Orders Placed This Encounter  Procedures   XR Pelvis 1-2 Views   No orders of the defined types were placed in this encounter.   Subjective: Chief Complaint  Patient presents with   Right Hip -  Follow-up    Right total hip arthroplasty 10/03/2023    Review of Systems  Constitutional: Negative.   HENT: Negative.    Eyes: Negative.   Respiratory: Negative.    Cardiovascular: Negative.   Endocrine: Negative.   Musculoskeletal: Negative.   Neurological: Negative.   Hematological: Negative.   Psychiatric/Behavioral: Negative.    All other systems reviewed and are negative.  Objective: Vital Signs: LMP 02/15/2021 (Approximate)   Physical Exam Vitals and nursing note reviewed.  Constitutional:      Appearance: She is well-developed.  HENT:     Head: Normocephalic and atraumatic.  Pulmonary:     Effort: Pulmonary effort is normal.  Abdominal:     Palpations: Abdomen is soft.  Musculoskeletal:     Cervical back: Neck supple.  Skin:    General: Skin is warm.     Capillary Refill: Capillary refill takes less than 2 seconds.  Neurological:     Mental Status: She is alert and oriented to person, place, and time.  Psychiatric:        Behavior: Behavior normal.        Thought Content: Thought content normal.        Judgment: Judgment normal.    Imaging: XR Pelvis 1-2 Views Result Date: 04/03/2024 Stable right total hip replacement without complications   PMFS History: Patient Active Problem List   Diagnosis Date Noted   Status post total replacement of right hip 10/03/2023   Primary osteoarthritis of right hip 10/02/2023   Genetic testing 09/29/2021   Carcinoma of  upper-outer quadrant of right breast in female, estrogen receptor positive (HCC) 03/04/2021   Major depressive disorder, recurrent, mild (HCC) 09/16/2019   Unintended weight gain 09/16/2019   Pain in limb 07/27/2018   OSA (obstructive sleep apnea) 04/15/2018   Intermittent vertigo 02/08/2018   Snoring 02/08/2018   Decreased sexual desire 08/02/2017   Swelling of limb 12/27/2016   Morbid obesity with body mass index of 40.0-49.9 (HCC) 12/02/2014   Asymptomatic varicose veins of both lower  extremities 03/25/2014   Absence of bladder continence 03/25/2014   Past Medical History:  Diagnosis Date   Anxiety    Arthritis    Cancer (HCC) 2022   Right Breast   Depression    History of kidney stones    Pneumonia    Mild   Varicose veins of both lower extremities    Vertigo     Family History  Problem Relation Age of Onset   Varicose Veins Mother    Breast cancer Neg Hx     Past Surgical History:  Procedure Laterality Date   BREAST BIOPSY Right 02/24/2021   u/s bx "   BREAST LUMPECTOMY WITH SENTINEL LYMPH NODE BIOPSY Right 03/24/2021   Procedure: BREAST LUMPECTOMY WITH SENTINEL LYMPH NODE BX;  Surgeon: Marshall Skeeter, MD;  Location: [facility] ORS;  Service: General;  Laterality: Right;   CESAREAN SECTION  1996   CHOLECYSTECTOMY     COLONOSCOPY WITH PROPOFOL  N/A 05/13/2019   Procedure: COLONOSCOPY WITH PROPOFOL ;  Surgeon: Cassie Click, MD;  Location: [facility] ENDOSCOPY;  Service: Endoscopy;  Laterality: N/A;   COLONOSCOPY WITH PROPOFOL  N/A 09/25/2019   Procedure: COLONOSCOPY WITH PROPOFOL ;  Surgeon: Toledo, Alphonsus Jeans, MD;  Location: [facility] ENDOSCOPY;  Service: Gastroenterology;  Laterality: N/A;   DIAGNOSTIC LAPAROSCOPY     TONSILLECTOMY  2014   TOTAL HIP ARTHROPLASTY Right 10/03/2023   Procedure: RIGHT TOTAL HIP ARTHROPLASTY ANTERIOR APPROACH;  Surgeon: Wes Hamman, MD;  Location: [facility] OR;  Service: Orthopedics;  Laterality: Right;  3-C   TUBAL LIGATION  12/30/2015   VAGINAL DELIVERY  12/29/2005   VAGINAL DELIVERY  2000   Social History   Occupational History   Not on file  Tobacco Use   Smoking status: Never   Smokeless tobacco: Never  Vaping Use   Vaping status: Never Used  Substance and Sexual Activity   Alcohol use: Not Currently    Comment: Occasionally   Drug use: No   Sexual activity: Yes

## 2024-06-14 ENCOUNTER — Emergency Department: Payer: Worker's Compensation

## 2024-06-14 ENCOUNTER — Emergency Department
Admission: EM | Admit: 2024-06-14 | Discharge: 2024-06-14 | Disposition: A | Discharge status: Home / Self Care | Payer: Worker's Compensation | Attending: Emergency Medicine | Admitting: Emergency Medicine

## 2024-06-14 ENCOUNTER — Other Ambulatory Visit: Payer: Self-pay

## 2024-06-14 DIAGNOSIS — Y990 Civilian activity done for income or pay: Secondary | ICD-10-CM | POA: Insufficient documentation

## 2024-06-14 DIAGNOSIS — S39012A Strain of muscle, fascia and tendon of lower back, initial encounter: Secondary | ICD-10-CM | POA: Insufficient documentation

## 2024-06-14 DIAGNOSIS — X501XXA Overexertion from prolonged static or awkward postures, initial encounter: Secondary | ICD-10-CM | POA: Insufficient documentation

## 2024-06-14 DIAGNOSIS — S3992XA Unspecified injury of lower back, initial encounter: Secondary | ICD-10-CM | POA: Diagnosis present

## 2024-06-14 MED ORDER — PREDNISONE 10 MG (21) PO TBPK: ORAL_TABLET | ORAL | 0 refills | Status: DC

## 2024-06-14 MED ORDER — LIDOCAINE 5 % EX PTCH
1.0000 | MEDICATED_PATCH | Freq: Once | CUTANEOUS | Status: DC
  Administered 2024-06-14: 1 via TRANSDERMAL
  Filled 2024-06-14: qty 1

## 2024-06-14 MED ORDER — BACLOFEN 10 MG PO TABS: 10.0000 mg | ORAL_TABLET | Freq: Three times a day (TID) | ORAL | 0 refills | Status: AC

## 2024-06-14 MED ORDER — CYCLOBENZAPRINE HCL 10 MG PO TABS
10.0000 mg | ORAL_TABLET | Freq: Once | ORAL | Status: AC
  Administered 2024-06-14: 10 mg via ORAL
  Filled 2024-06-14: qty 1

## 2024-06-14 MED ORDER — TRAMADOL HCL 50 MG PO TABS: 50.0000 mg | ORAL_TABLET | Freq: Four times a day (QID) | ORAL | 0 refills | Status: DC | PRN

## 2024-06-14 MED ORDER — KETOROLAC TROMETHAMINE 15 MG/ML IJ SOLN
15.0000 mg | Freq: Once | INTRAMUSCULAR | Status: AC
Start: 2024-06-14 — End: 2024-06-14
  Administered 2024-06-14: 15 mg via INTRAMUSCULAR
  Filled 2024-06-14: qty 1

## 2024-06-14 NOTE — ED Triage Notes (Addendum)
 Pt to ED [facility] from work at [employer] for lower back pain after picking up cart, felt pop and pain. Report pain with standing.  VSS, NAD noted.  Workers comp Environmental consultant

## 2024-06-14 NOTE — ED Notes (Signed)
 Patient contacted supervisor who declined drug testing.

## 2024-06-14 NOTE — Discharge Instructions (Signed)
 Follow-up with orthopedics of Worker's [employer]. choice for a recheck next week Apply ice to the lower back Take the Sterapred and baclofen as prescribed.  Tramadol  is for pain not controlled by these medications.  You may also take Tylenol 

## 2024-06-14 NOTE — ED Provider Notes (Signed)
 [facility] Provider Note    Event Date/Time   First MD Initiated Contact with Patient 06/14/24 1023     (approximate)   History   Back Pain   HPI  Kelsey Chavez is a 58 y.o. female history of varicose veins, depression, pneumonia, anxiety and kidney stones presents emergency department with concerns of low back pain.  Patient was at work when she bent down to pick up about 30 pounds worth of mail.  Had a sharp pain in the back and now has radiation into the legs.  No history of back pain or injuries.  Bladder control.  Incident happened today at work.  Patient works at the [employer]      Physical Exam   Triage Vital Signs: ED Triage Vitals  Encounter Vitals Group     BP 06/14/24 0956 136/66     Girls Systolic BP Percentile --      Girls Diastolic BP Percentile --      Boys Systolic BP Percentile --      Boys Diastolic BP Percentile --      Pulse Rate 06/14/24 0956 65     Resp 06/14/24 0956 17     Temp 06/14/24 0956 98.1 F (36.7 C)     Temp Source 06/14/24 0956 Oral     SpO2 06/14/24 0956 97 %     Weight 06/14/24 0948 190 lb (86.2 kg)     Height 06/14/24 0948 5' (1.524 m)     Head Circumference --      Peak Flow --      Pain Score 06/14/24 0948 8     Pain Loc --      Pain Education --      Exclude from Growth Chart --     Most recent vital signs: Vitals:   06/14/24 0956  BP: 136/66  Pulse: 65  Resp: 17  Temp: 98.1 F (36.7 C)  SpO2: 97%     General: Awake, no distress.   CV:  Good peripheral perfusion.  Resp:  Normal effort.  Abd:  No distention.   Other:  Lumbar spine tender, patient appears to be uncomfortable, neurovascular intact   ED Results / Procedures / Treatments   Labs (all labs ordered are listed, but only abnormal results are displayed) Labs Reviewed - No data to display   EKG     RADIOLOGY X-ray lumbar spine    PROCEDURES:   Procedures  Critical Care: No Chief Complaint  Patient  presents with   Back Pain      MEDICATIONS ORDERED IN ED: Medications  ketorolac  (TORADOL ) 15 MG/ML injection 15 mg (15 mg Intramuscular Given 06/14/24 1052)  cyclobenzaprine  (FLEXERIL ) tablet 10 mg (10 mg Oral Given 06/14/24 1051)     IMPRESSION / MDM / ASSESSMENT AND PLAN / ED COURSE  I reviewed the triage vital signs and the nursing notes.                              Differential diagnosis includes, but is not limited to, lumbar strain, fracture, ruptured disc, sciatica  Patient's presentation is most consistent with acute illness / injury with system symptoms.   Medications given: Toradol  15 mg IM, Flexeril  10 mg p.o.  X-ray lumbar spine  X-ray lumbar spine was independently reviewed interpreted by me as being negative for mildly  Did explain this finding to the patient.  She is still little  uncomfortable but somewhat better.  Feel that we need to place her on strict work restrictions.  Have her follow-up with orthopedic and Worker's Comp. choice.  Work restrictions included no lifting, no extended standing sitting etc.  She was given a prescription for baclofen , prednisone , and tramadol .  Return emergency department if worsening.  In agreement with treatment plan.  Worker's Comp. restrictions given to her in writing.  She was discharged stable condition.     FINAL CLINICAL IMPRESSION(S) / ED DIAGNOSES   Final diagnoses:  Acute myofascial strain of lumbar region, initial encounter     Rx / DC Orders   ED Discharge Orders          Ordered    predniSONE  (STERAPRED UNI-PAK 21 TAB) 10 MG (21) TBPK tablet        06/14/24 1149    baclofen  (LIORESAL ) 10 MG tablet  3 times daily        06/14/24 1149    traMADol  (ULTRAM ) 50 MG tablet  Every 6 hours PRN        06/14/24 1149             Note:  This document was prepared using Dragon voice recognition software and may include unintentional dictation errors.    Gasper Devere ORN, PA-C 06/14/24 CLAIR    Suzanne Kirsch, MD 06/15/24 [phone]

## 2024-06-19 ENCOUNTER — Encounter: Payer: Self-pay | Admitting: Internal Medicine

## 2024-06-19 ENCOUNTER — Inpatient Hospital Stay: Payer: Medicaid Other | Attending: Internal Medicine | Admitting: Internal Medicine

## 2024-06-19 VITALS — BP 106/65 | HR 61 | Temp 97.5°F | Resp 18 | Ht 60.0 in | Wt 185.9 lb

## 2024-06-19 DIAGNOSIS — M8580 Other specified disorders of bone density and structure, unspecified site: Secondary | ICD-10-CM | POA: Insufficient documentation

## 2024-06-19 DIAGNOSIS — Z79810 Long term (current) use of selective estrogen receptor modulators (SERMs): Secondary | ICD-10-CM | POA: Diagnosis not present

## 2024-06-19 DIAGNOSIS — Z170 Estrogen receptor positive status [ER+]: Secondary | ICD-10-CM | POA: Diagnosis not present

## 2024-06-19 DIAGNOSIS — C50411 Malignant neoplasm of upper-outer quadrant of right female breast: Secondary | ICD-10-CM | POA: Insufficient documentation

## 2024-06-19 NOTE — Progress Notes (Signed)
 03/27/24 mammogram.  Pt hurt her back at work last week. Not working due to this and on new meds for pain, list updated.

## 2024-06-19 NOTE — Assessment & Plan Note (Addendum)
#  Breast cancer stage I ER/PR positive HER2 negative] postlumpectomy[Dr.Byrnett].   T1b; grade 1; no LVI] [low risk; NO oncotype; no chemo]. currently on Tamoxifen  [started sep 1st, 2022 x 5 year]; [id number]. [CC]  MAY 2025-BIL mammogram- WNL   # No evidence of any recurrence.  Continue tamoxifen  [until fall 2025] tolerating well except for mild hot flashes. Refilled Tamoxifen . Discussed re: switching to AI- but given MSK hold off.   # MSK- G-1-2; Right hip pain- s/p  THR - KC- stable/improved- stable.   # Bone health-  ; LOW vit D- [as per PCP]- on vit D ergo-; MAy 2025- Osteopenia based on BMD- monitor for now.   # DISPOSITION:.  # follow up in 6 months- MD; labs- cbc/cmp; vit D- 25-OH---Dr.B

## 2024-06-19 NOTE — Progress Notes (Signed)
 [facility] CONSULT NOTE  Patient Care Team: Sadie Manna, MD as PCP - General (Internal Medicine) Cindie Jesusa HERO, RN as Oncology Nurse Navigator Rennie Cindy SAUNDERS, MD as Consulting Physician (Internal Medicine) Lenn Aran, MD as Consulting Physician (Radiation Oncology) Dessa Reyes ORN, MD as Consulting Physician (General Surgery)  CHIEF COMPLAINTS/PURPOSE OF CONSULTATION: Breast cancer  #  Oncology History Overview Note  # RIGHT BREAST CA- cT1b [55mm]c N0; ER/PR- POSITIVE  [>90%]; her 2 NEG; GRADE-1 [Dr.Byrnett]; status postlumpectomy-grade 1; tumor size 7 mm; no LVI; clear margins-no Oncotype recommended.s/p RT.  # SEP 1st week, 2022-start tamoxifen  [perimenopausal]; wean off Wellbutrin; OCT 2022- celexa                         # SURVIVORSHIP:   # GENETICS:   DIAGNOSIS:   STAGE:         ;  GOALS:  CURRENT/MOST RECENT THERAPY :       Carcinoma of upper-outer quadrant of right breast in female, estrogen receptor positive (HCC)  03/04/2021 Initial Diagnosis   Carcinoma of upper-outer quadrant of right breast in female, estrogen receptor positive (HCC)   03/04/2021 Cancer Staging   Staging form: Breast, AJCC 8th Edition - Clinical: Stage IA (cT1b, cN0, cM0, G1, ER+, PR+, HER2-) - Signed by Rennie Cindy SAUNDERS, MD on 03/04/2021 Histologic grading system: 3 grade system    Genetic Testing   Negative genetic testing. No pathogenic variants identified on the [employer] CustomNext+RNA panel. The report date is 09/27/2021.  The CustomNext-Cancer + RNAinsight panel  includes sequencing and/or deletion duplication testing of the following 47 genes: APC, ATM, AXIN2, BARD1, BMPR1A, BRCA1, BRCA2, BRIP1, CDH1, CDKN2A (p14ARF), CDKN2A (p16INK4a), CKD4, CHEK2, CTNNA1, DICER1, EPCAM (Deletion/duplication testing only), GREM1 (promoter region deletion/duplication testing only), KIT, MEN1, MLH1, MSH2, MSH3, MSH6, MUTYH, NBN, NF1, NHTL1, PALB2, PDGFRA, PMS2, POLD1,  POLE, PTEN, RAD50, RAD51C, RAD51D, SDHB, SDHC, SDHD, SMAD4, SMARCA4. STK11, TP53, TSC1, TSC2, and VHL.  The following genes were evaluated for sequence changes only: SDHA and HOXB13 c.251G>A variant only.    HISTORY OF PRESENTING ILLNESS: with daughter.  Ambulating independently.  Kelsey Chavez 58 y.o.  female patient with recent history of stage I ER/PR positive HER2 negative breast cancer-currently tamoxifen  is here for follow-up.  Admits to compliance with tamoxifen .  intermittent joint pains are not worse.  Denies any significant hot flashes in the day time.  Denies vaginal bleeding  Review of Systems  Constitutional:  Negative for chills, diaphoresis, fever, malaise/fatigue and weight loss.  HENT:  Negative for nosebleeds and sore throat.   Eyes:  Negative for double vision.  Respiratory:  Negative for cough, hemoptysis, sputum production, shortness of breath and wheezing.   Cardiovascular:  Negative for chest pain, palpitations, orthopnea and leg swelling.  Gastrointestinal:  Negative for abdominal pain, blood in stool, constipation, diarrhea, heartburn, melena, nausea and vomiting.  Genitourinary:  Negative for dysuria, frequency and urgency.  Musculoskeletal:  Negative for back pain and joint pain.  Skin: Negative.  Negative for itching and rash.  Neurological:  Negative for dizziness, tingling, focal weakness, weakness and headaches.  Endo/Heme/Allergies:  Does not bruise/bleed easily.  Psychiatric/Behavioral:  Negative for depression. The patient is not nervous/anxious and does not have insomnia.      MEDICAL HISTORY:  Past Medical History:  Diagnosis Date   Anxiety    Arthritis    Cancer (HCC) 2022   Right Breast   Depression    History of  kidney stones    Pneumonia    Mild   Varicose veins of both lower extremities    Vertigo     SURGICAL HISTORY: Past Surgical History:  Procedure Laterality Date   BREAST BIOPSY Right 02/24/2021   u/s bx    BREAST  LUMPECTOMY WITH SENTINEL LYMPH NODE BIOPSY Right 03/24/2021   Procedure: BREAST LUMPECTOMY WITH SENTINEL LYMPH NODE BX;  Surgeon: Dessa Reyes ORN, MD;  Location: [facility] ORS;  Service: General;  Laterality: Right;   CESAREAN SECTION  1996   CHOLECYSTECTOMY     COLONOSCOPY WITH PROPOFOL  N/A 05/13/2019   Procedure: COLONOSCOPY WITH PROPOFOL ;  Surgeon: Viktoria Lamar DASEN, MD;  Location: [facility] ENDOSCOPY;  Service: Endoscopy;  Laterality: N/A;   COLONOSCOPY WITH PROPOFOL  N/A 09/25/2019   Procedure: COLONOSCOPY WITH PROPOFOL ;  Surgeon: Toledo, Ladell POUR, MD;  Location: [facility] ENDOSCOPY;  Service: Gastroenterology;  Laterality: N/A;   DIAGNOSTIC LAPAROSCOPY     TONSILLECTOMY  2014   TOTAL HIP ARTHROPLASTY Right 10/03/2023   Procedure: RIGHT TOTAL HIP ARTHROPLASTY ANTERIOR APPROACH;  Surgeon: Jerri Kay HERO, MD;  Location: [facility] OR;  Service: Orthopedics;  Laterality: Right;  3-C   TUBAL LIGATION  12/30/2015   VAGINAL DELIVERY  12/29/2005   VAGINAL DELIVERY  2000    SOCIAL HISTORY: Social History   Socioeconomic History   Marital status: Legally Separated    Spouse name: Not on file   Number of children: Not on file   Years of education: Not on file   Highest education level: Not on file  Occupational History   Not on file  Tobacco Use   Smoking status: Never   Smokeless tobacco: Never  Vaping Use   Vaping status: Never Used  Substance and Sexual Activity   Alcohol use: Not Currently    Comment: Occasionally   Drug use: No   Sexual activity: Yes  Other Topics Concern   Not on file  Social History Narrative   Works at [employer]; lives in [place] with daughters; never smoked; no alcohol.   Social Drivers of Corporate investment banker Strain: Low Risk  (05/31/2024)   Received from [facility]   Overall Financial Resource Strain (CARDIA)    Difficulty of Paying Living Expenses: Not hard at all  Food Insecurity: No Food Insecurity (05/31/2024)   Received from [facility]   Hunger Vital Sign    Within the past 12 months, you worried that your food would run out before you got the money to buy more.: Never true    Within the past 12 months, the food you bought just didn't last and you didn't have money to get more.: Never true  Transportation Needs: No Transportation Needs (05/31/2024)   Received from [facility] - Transportation    In the past 12 months, has lack of transportation kept you from medical appointments or from getting medications?: No    Lack of Transportation (Non-Medical): No  Physical Activity: Not on file  Stress: Not on file  Social Connections: Not on file  Intimate Partner Violence: Not on file    FAMILY HISTORY: Family History  Problem Relation Age of Onset   Varicose Veins Mother    Breast cancer Neg Hx     ALLERGIES:  is allergic to aspirin and shrimp [shellfish allergy].  MEDICATIONS:  Current Outpatient Medications  Medication Sig Dispense Refill   baclofen  (LIORESAL ) 10 MG tablet Take 1 tablet (10 mg total) by  mouth 3 (three) times daily for 7 days. 21 tablet 0   docusate sodium  (COLACE) 100 MG capsule Take 1 capsule (100 mg total) by mouth daily as needed. 30 capsule 2   methocarbamol  (ROBAXIN -750) 750 MG tablet Take 1 tablet (750 mg total) by mouth 2 (two) times daily as needed for muscle spasms. 20 tablet 2   predniSONE  (STERAPRED UNI-PAK 21 TAB) 10 MG (21) TBPK tablet Take 6 pills on day one then decrease by 1 pill each day 21 tablet 0   tamoxifen  (NOLVADEX ) 20 MG tablet Take 1 tablet (20 mg total) by mouth daily. 90 tablet 1   traMADol  (ULTRAM ) 50 MG tablet Take 1 tablet (50 mg total) by mouth every 6 (six) hours as needed. 15 tablet 0   Vitamin D , Ergocalciferol , (DRISDOL) 1.25 MG (50000 UNIT) CAPS capsule Take 50,000 Units by mouth every Monday.     No current facility-administered medications for this visit.      Kelsey Chavez  PHYSICAL EXAMINATION: ECOG PERFORMANCE  STATUS: 0 - Asymptomatic  Vitals:   06/19/24 0921  BP: 106/65  Pulse: 61  Resp: 18  Temp: (!) 97.5 F (36.4 C)  SpO2: 99%     Filed Weights   06/19/24 0921  Weight: 185 lb 14.4 oz (84.3 kg)      Physical Exam HENT:     Head: Normocephalic and atraumatic.     Mouth/Throat:     Pharynx: No oropharyngeal exudate.  Eyes:     Pupils: Pupils are equal, round, and reactive to light.  Cardiovascular:     Rate and Rhythm: Normal rate and regular rhythm.  Pulmonary:     Effort: Pulmonary effort is normal. No respiratory distress.     Breath sounds: Normal breath sounds. No wheezing.  Abdominal:     General: Bowel sounds are normal. There is no distension.     Palpations: Abdomen is soft. There is no mass.     Tenderness: There is no abdominal tenderness. There is no guarding or rebound.  Musculoskeletal:        General: No tenderness. Normal range of motion.     Cervical back: Normal range of motion and neck supple.  Skin:    General: Skin is warm.  Neurological:     Mental Status: She is alert and oriented to person, place, and time.  Psychiatric:        Mood and Affect: Affect normal.      LABORATORY DATA:  I have reviewed the data as listed Lab Results  Component Value Date   WBC 3.6 (L) 12/22/2023   HGB 11.7 (L) 12/22/2023   HCT 36.1 12/22/2023   MCV 92.1 12/22/2023   PLT 218 12/22/2023   Recent Labs    09/27/23 0906 12/22/23 1255  NA 139 137  K 4.1 3.8  CL 107 105  CO2 25 27  GLUCOSE 109* 106*  BUN 10 15  CREATININE 0.52 0.53  CALCIUM 9.1 8.7*  GFRNONAA >60 >60  PROT  --  7.1  ALBUMIN   --  3.8  AST  --  76*  ALT  --  75*  ALKPHOS  --  92  BILITOT  --  0.3    RADIOGRAPHIC STUDIES: I have personally reviewed the radiological images as listed and agreed with the findings in the report. DG Lumbar Spine 2-3 Views Result Date: 06/14/2024 CLINICAL DATA:  58 year old female with low back pain after heavy lifting. Felt a pop. EXAM: LUMBAR SPINE -  2-3 VIEW COMPARISON:  None Available. FINDINGS: Three views. Normal lumbar segmentation. Straightening of lumbar lordosis. Maintained vertebral height. Disc spaces appear normal for age. Mild anterior endplate spurring at L3-L4. Grossly intact sacrum. No acute osseous abnormality identified. Cholecystectomy clips. Partially visible right hip arthroplasty. Nonobstructed bowel-gas pattern. IMPRESSION: No acute osseous abnormality identified. Negative for age radiographic appearance of the lumbar spine. Electronically Signed   By: VEAR Hurst M.D.   On: 06/14/2024 11:37     ASSESSMENT & PLAN:   Carcinoma of upper-outer quadrant of right breast in female, estrogen receptor positive (HCC) #Breast cancer stage I ER/PR positive HER2 negative] postlumpectomy[Dr.Byrnett].   T1b; grade 1; no LVI] [low risk; NO oncotype; no chemo]. currently on Tamoxifen  [started sep 1st, 2022 x 5 year]; [id number]. [CC]  MAY 2025-BIL mammogram- WNL   # No evidence of any recurrence.  Continue tamoxifen  [until fall 2025] tolerating well except for mild hot flashes. Refilled Tamoxifen . Discussed re: switching to AI- but given MSK hold off.   # MSK- G-1-2; Right hip pain- s/p  THR - KC- stable/improved- stable.   # Bone health-  ; LOW vit D- [as per PCP]- on vit D ergo-; MAy 2025- Osteopenia based on BMD- monitor for now.   # DISPOSITION:.  # follow up in 6 months- MD; labs- cbc/cmp; vit D- 25-OH---Dr.B   All questions were answered. The patient/family knows to call the clinic with any problems, questions or concerns.    Etai Copado R Malai Lady, MD 06/19/2024 10:00 AM

## 2024-07-17 ENCOUNTER — Ambulatory Visit: Payer: 59 | Admitting: Radiation Oncology

## 2024-07-22 ENCOUNTER — Other Ambulatory Visit: Payer: Self-pay | Admitting: Internal Medicine

## 2024-07-26 ENCOUNTER — Telehealth: Payer: Self-pay | Admitting: *Deleted

## 2024-07-26 NOTE — Telephone Encounter (Signed)
 Patient needed a refill for her tamoxifen  and Dr. Rennie had put it in just this morning so she can go pick it up at [facility] and she understands that

## 2024-09-16 ENCOUNTER — Encounter: Payer: Self-pay | Admitting: Radiology

## 2024-10-04 ENCOUNTER — Ambulatory Visit: Admitting: Physician Assistant

## 2024-10-07 ENCOUNTER — Ambulatory Visit: Admitting: Radiation Oncology

## 2024-10-16 ENCOUNTER — Ambulatory Visit: Admitting: Physician Assistant

## 2024-11-20 ENCOUNTER — Ambulatory Visit: Admitting: Physician Assistant

## 2024-11-20 ENCOUNTER — Other Ambulatory Visit (INDEPENDENT_AMBULATORY_CARE_PROVIDER_SITE_OTHER): Payer: Self-pay

## 2024-11-20 DIAGNOSIS — Z96641 Presence of right artificial hip joint: Secondary | ICD-10-CM | POA: Diagnosis not present

## 2024-11-20 NOTE — Progress Notes (Signed)
 "  Post-Op Visit Note   Patient: Kelsey Chavez           Date of Birth: Apr 27, 1966           MRN: [id number] Visit Date: 11/20/2024 PCP: Sadie Manna, MD   Assessment & Plan:  Chief Complaint:  Chief Complaint  Patient presents with   Right Hip - Follow-up    Right total hip arthroplasty 10/03/2023   Visit Diagnoses:  1. Status post total replacement of right hip     Plan: Patient is a pleasant 59 year old female who comes in today 1 year status post right total hip replacement.  She notes some paresthesias to the right lateral hip and occasional pain of the right buttock but no pain into the groin or anterior thigh.  Overall, very pleased with the results of surgery.  Examination of her right hip reveals painless hip flexion and logroll.  She is neurovascularly intact distally.  At this point, she will continue to advance with activity as tolerated.  Follow-up in 1 year for repeat evaluation and AP pelvis x-rays.  Call with concerns or questions.  Follow-Up Instructions: Return in about 1 year (around 11/20/2025).   Orders:  Orders Placed This Encounter  Procedures   XR HIP UNILAT W OR W/O PELVIS 2-3 VIEWS RIGHT   No orders of the defined types were placed in this encounter.   Imaging: No results found.  PMFS History: Patient Active Problem List   Diagnosis Date Noted   Status post total replacement of right hip 10/03/2023   Primary osteoarthritis of right hip 10/02/2023   Genetic testing 09/29/2021   Carcinoma of upper-outer quadrant of right breast in female, estrogen receptor positive (HCC) 03/04/2021   Major depressive disorder, recurrent, mild 09/16/2019   Unintended weight gain 09/16/2019   Pain in limb 07/27/2018   OSA (obstructive sleep apnea) 04/15/2018   Intermittent vertigo 02/08/2018   Snoring 02/08/2018   Decreased sexual desire 08/02/2017   Swelling of limb 12/27/2016   Morbid obesity with body mass index of 40.0-49.9 (HCC) 12/02/2014    Asymptomatic varicose veins of both lower extremities 03/25/2014   Absence of bladder continence 03/25/2014   Past Medical History:  Diagnosis Date   Anxiety    Arthritis    Cancer (HCC) 2022   Right Breast   Depression    History of kidney stones    Pneumonia    Mild   Varicose veins of both lower extremities    Vertigo     Family History  Problem Relation Age of Onset   Varicose Veins Mother    Breast cancer Neg Hx     Past Surgical History:  Procedure Laterality Date   BREAST BIOPSY Right 02/24/2021   u/s bx    BREAST LUMPECTOMY WITH SENTINEL LYMPH NODE BIOPSY Right 03/24/2021   Procedure: BREAST LUMPECTOMY WITH SENTINEL LYMPH NODE BX;  Surgeon: Dessa Reyes ORN, MD;  Location: [facility] ORS;  Service: General;  Laterality: Right;   CESAREAN SECTION  1996   CHOLECYSTECTOMY     COLONOSCOPY WITH PROPOFOL  N/A 05/13/2019   Procedure: COLONOSCOPY WITH PROPOFOL ;  Surgeon: Viktoria Lamar DASEN, MD;  Location: [facility] ENDOSCOPY;  Service: Endoscopy;  Laterality: N/A;   COLONOSCOPY WITH PROPOFOL  N/A 09/25/2019   Procedure: COLONOSCOPY WITH PROPOFOL ;  Surgeon: Toledo, Ladell POUR, MD;  Location: [facility] ENDOSCOPY;  Service: Gastroenterology;  Laterality: N/A;   DIAGNOSTIC LAPAROSCOPY     TONSILLECTOMY  2014   TOTAL HIP ARTHROPLASTY Right 10/03/2023  Procedure: RIGHT TOTAL HIP ARTHROPLASTY ANTERIOR APPROACH;  Surgeon: Jerri Kay HERO, MD;  Location: [facility] OR;  Service: Orthopedics;  Laterality: Right;  3-C   TUBAL LIGATION  12/30/2015   VAGINAL DELIVERY  12/29/2005   VAGINAL DELIVERY  2000   Social History   Occupational History   Not on file  Tobacco Use   Smoking status: Never   Smokeless tobacco: Never  Vaping Use   Vaping status: Never Used  Substance and Sexual Activity   Alcohol use: Not Currently    Comment: Occasionally   Drug use: No   Sexual activity: Yes     "

## 2024-12-02 ENCOUNTER — Ambulatory Visit: Payer: Self-pay | Admitting: Radiation Oncology

## 2024-12-11 ENCOUNTER — Encounter: Payer: Self-pay | Admitting: Radiation Oncology

## 2024-12-11 ENCOUNTER — Ambulatory Visit
Admission: RE | Admit: 2024-12-11 | Discharge: 2024-12-11 | Disposition: A | Discharge status: Home / Self Care | Payer: Self-pay | Source: Ambulatory Visit | Attending: Radiation Oncology | Admitting: Radiation Oncology

## 2024-12-11 VITALS — BP 105/73 | HR 73 | Temp 98.3°F | Resp 16 | Wt 197.0 lb

## 2024-12-11 DIAGNOSIS — N644 Mastodynia: Secondary | ICD-10-CM | POA: Insufficient documentation

## 2024-12-11 DIAGNOSIS — Z923 Personal history of irradiation: Secondary | ICD-10-CM | POA: Insufficient documentation

## 2024-12-11 DIAGNOSIS — Z79810 Long term (current) use of selective estrogen receptor modulators (SERMs): Secondary | ICD-10-CM | POA: Insufficient documentation

## 2024-12-11 DIAGNOSIS — C50411 Malignant neoplasm of upper-outer quadrant of right female breast: Secondary | ICD-10-CM | POA: Insufficient documentation

## 2024-12-11 DIAGNOSIS — Z170 Estrogen receptor positive status [ER+]: Secondary | ICD-10-CM | POA: Insufficient documentation

## 2024-12-11 NOTE — Progress Notes (Signed)
 Radiation Oncology Follow up Note  Name: Kelsey Chavez   Date:   12/11/2024 MRN:  [id number] DOB: Mar 25, 1966    This 59 y.o. female presents to the clinic today for 3-year follow-up status post whole breast radiation to her right breast for stage I ER positive invasive mammary carcinoma.  REFERRING PROVIDER: Sadie Manna, MD  HPI: Patient is a 59 year old female now out over 3 years having completed whole breast radiation to her right breast for stage I ER positive invasive mammary carcinoma.  Seen today in routine follow-up she is doing well.  Occasionally does have some slight pain in her right breast most likely related to scarring.  She specifically denies cough or bone pain..  She had mammograms back in May which I have reviewed were BI-RADS 1 benign.  She is currently on tamoxifen  tolerating it well without side effect.  COMPLICATIONS OF TREATMENT: none  FOLLOW UP COMPLIANCE: keeps appointments   PHYSICAL EXAM:  BP 105/73   Pulse 73   Temp 98.3 F (36.8 C)   Resp 16   Wt 197 lb (89.4 kg)   LMP 02/15/2021   BMI 38.47 kg/m  Lungs are clear to A&P cardiac examination essentially unremarkable with regular rate and rhythm. No dominant mass or nodularity is noted in either breast in 2 positions examined. Incision is well-healed. No axillary or supraclavicular adenopathy is appreciated. Cosmetic result is excellent.  Well-developed well-nourished patient in NAD. HEENT reveals PERLA, EOMI, discs not visualized.  Oral cavity is clear. No oral mucosal lesions are identified. Neck is clear without evidence of cervical or supraclavicular adenopathy. Lungs are clear to A&P. Cardiac examination is essentially unremarkable with regular rate and rhythm without murmur rub or thrill. Abdomen is benign with no organomegaly or masses noted. Motor sensory and DTR levels are equal and symmetric in the upper and lower extremities. Cranial nerves II through XII are grossly intact. Proprioception is  intact. No peripheral adenopathy or edema is identified. No motor or sensory levels are noted. Crude visual fields are within normal range.  RADIOLOGY RESULTS: Mammograms reviewed compatible with above-stated findings  PLAN: Present time patient is doing well with no evidence of disease after 3 years of follow-up.  I am turning follow-up care over to medical oncology.  Patient knows to call at anytime with any concerns.  She continues on tamoxifen  without side effect.  I would like to take this opportunity to thank you for allowing me to participate in the care of your patient.SABRA Marcey Penton, MD

## 2024-12-20 ENCOUNTER — Other Ambulatory Visit

## 2024-12-20 ENCOUNTER — Ambulatory Visit: Admitting: Internal Medicine

## 2024-12-23 ENCOUNTER — Inpatient Hospital Stay: Admitting: Internal Medicine

## 2024-12-23 ENCOUNTER — Inpatient Hospital Stay
